# Patient Record
Sex: Female | Born: 1960 | Race: White | Hispanic: No | Smoking: Never smoker
Health system: Southern US, Community
[De-identification: ages and names within clinical notes are randomized; demographics above are authoritative.]

## PROBLEM LIST (undated history)

## (undated) DIAGNOSIS — R51 Headache: Secondary | ICD-10-CM

## (undated) DIAGNOSIS — R3915 Urgency of urination: Secondary | ICD-10-CM

## (undated) DIAGNOSIS — R000 Tachycardia, unspecified: Secondary | ICD-10-CM

## (undated) DIAGNOSIS — F419 Anxiety disorder, unspecified: Secondary | ICD-10-CM

## (undated) DIAGNOSIS — F32A Depression, unspecified: Secondary | ICD-10-CM

## (undated) DIAGNOSIS — F329 Major depressive disorder, single episode, unspecified: Secondary | ICD-10-CM

## (undated) DIAGNOSIS — K649 Unspecified hemorrhoids: Secondary | ICD-10-CM

## (undated) DIAGNOSIS — M1990 Unspecified osteoarthritis, unspecified site: Secondary | ICD-10-CM

## (undated) DIAGNOSIS — G8929 Other chronic pain: Secondary | ICD-10-CM

## (undated) DIAGNOSIS — R519 Headache, unspecified: Secondary | ICD-10-CM

## (undated) DIAGNOSIS — I10 Essential (primary) hypertension: Secondary | ICD-10-CM

## (undated) DIAGNOSIS — G2581 Restless legs syndrome: Secondary | ICD-10-CM

## (undated) DIAGNOSIS — D649 Anemia, unspecified: Secondary | ICD-10-CM

## (undated) DIAGNOSIS — G4733 Obstructive sleep apnea (adult) (pediatric): Secondary | ICD-10-CM

## (undated) HISTORY — DX: Major depressive disorder, single episode, unspecified: F32.9

## (undated) HISTORY — DX: Other chronic pain: G89.29

## (undated) HISTORY — PX: MENISCUS REPAIR: SHX5179

## (undated) HISTORY — DX: Unspecified osteoarthritis, unspecified site: M19.90

## (undated) HISTORY — DX: Tachycardia, unspecified: R00.0

## (undated) HISTORY — DX: Essential (primary) hypertension: I10

## (undated) HISTORY — DX: Restless legs syndrome: G25.81

## (undated) HISTORY — DX: Urgency of urination: R39.15

## (undated) HISTORY — DX: Anxiety disorder, unspecified: F41.9

## (undated) HISTORY — DX: Unspecified hemorrhoids: K64.9

## (undated) HISTORY — PX: ROTATOR CUFF REPAIR: SHX139

## (undated) HISTORY — DX: Obstructive sleep apnea (adult) (pediatric): G47.33

## (undated) HISTORY — DX: Anemia, unspecified: D64.9

## (undated) HISTORY — DX: Headache: R51

## (undated) HISTORY — DX: Depression, unspecified: F32.A

## (undated) HISTORY — DX: Headache, unspecified: R51.9

---

## 1997-09-10 HISTORY — PX: NASAL SEPTUM SURGERY: SHX37

## 1997-09-10 HISTORY — PX: UVULOPALATOPHARYNGOPLASTY, TONSILLECTOMY AND SEPTOPLASTY: SHX2632

## 2003-10-27 ENCOUNTER — Ambulatory Visit (HOSPITAL_COMMUNITY): Admission: RE | Admit: 2003-10-27 | Discharge: 2003-10-27 | Payer: Self-pay | Admitting: Specialist

## 2004-03-23 ENCOUNTER — Encounter: Admission: RE | Admit: 2004-03-23 | Discharge: 2004-05-08 | Payer: Self-pay | Admitting: Specialist

## 2004-07-04 ENCOUNTER — Ambulatory Visit (HOSPITAL_BASED_OUTPATIENT_CLINIC_OR_DEPARTMENT_OTHER): Admission: RE | Admit: 2004-07-04 | Discharge: 2004-07-04 | Payer: Self-pay | Admitting: Otolaryngology

## 2005-01-12 ENCOUNTER — Ambulatory Visit (HOSPITAL_BASED_OUTPATIENT_CLINIC_OR_DEPARTMENT_OTHER): Admission: RE | Admit: 2005-01-12 | Discharge: 2005-01-12 | Payer: Self-pay | Admitting: Specialist

## 2009-09-09 ENCOUNTER — Inpatient Hospital Stay (HOSPITAL_COMMUNITY): Admission: EM | Admit: 2009-09-09 | Discharge: 2009-09-13 | Payer: Self-pay | Admitting: Emergency Medicine

## 2009-09-13 ENCOUNTER — Inpatient Hospital Stay (HOSPITAL_COMMUNITY): Admission: AD | Admit: 2009-09-13 | Discharge: 2009-09-16 | Payer: Self-pay | Admitting: Psychiatry

## 2009-09-13 ENCOUNTER — Ambulatory Visit: Payer: Self-pay | Admitting: Psychiatry

## 2010-01-27 ENCOUNTER — Encounter (INDEPENDENT_AMBULATORY_CARE_PROVIDER_SITE_OTHER): Payer: Self-pay

## 2010-10-12 NOTE — Letter (Signed)
Summary: Recall, Screening Colonoscopy Only  [facility]  [address]   [place], [place] [address]   Phone: [phone]  Fax: [phone]    Jan 27, 2010  Caitlin Holmes [address] [place], [place]  [address] [birth year]   Dear Caitlin Holmes,   Our records indicate it is time to schedule your colonoscopy.   Please call our office at [phone] and ask for the nurse.   Thank you,  Hendricks Limes, LPN Cloria Spring, LPN  [facility] Ph: [phone]   Fax: [phone]

## 2010-11-26 LAB — BASIC METABOLIC PANEL
BUN: 7 mg/dL (ref 6–23)
CO2: 27 mEq/L (ref 19–32)
CO2: 29 mEq/L (ref 19–32)
Chloride: 103 mEq/L (ref 96–112)
Chloride: 106 mEq/L (ref 96–112)
GFR calc Af Amer: >60 mL/min (ref >60)
GFR calc non Af Amer: >60 mL/min (ref >60)
Glucose, Bld: 103 mg/dL — ABNORMAL HIGH (ref 70–99)
Potassium: 3.6 mEq/L (ref 3.5–5.1)
Potassium: 3.6 mEq/L (ref 3.5–5.1)
Sodium: 140 mEq/L (ref 135–145)

## 2010-11-26 LAB — TSH: TSH: 4.264 u[IU]/mL (ref 0.350–4.500)

## 2010-12-11 LAB — COMPREHENSIVE METABOLIC PANEL
ALT: 28 U/L (ref 0–35)
AST: 27 U/L (ref 0–37)
Alkaline Phosphatase: 83 U/L (ref 39–117)
CO2: 27 mEq/L (ref 19–32)
Calcium: 8.1 mg/dL — ABNORMAL LOW (ref 8.4–10.5)
GFR calc Af Amer: >60 mL/min (ref >60)
Glucose, Bld: 87 mg/dL (ref 70–99)
Potassium: 4.2 mEq/L (ref 3.5–5.1)
Sodium: 137 mEq/L (ref 135–145)
Total Protein: 6.3 g/dL (ref 6.0–8.3)

## 2010-12-11 LAB — CBC
Hemoglobin: 10.2 g/dL — ABNORMAL LOW (ref 12.0–15.0)
MCHC: 32.6 g/dL (ref 30.0–36.0)
RBC: 4.14 MIL/uL (ref 3.87–5.11)
RDW: 15.2 % (ref 11.5–15.5)

## 2010-12-11 LAB — URINALYSIS, ROUTINE W REFLEX MICROSCOPIC
Glucose, UA: NEGATIVE mg/dL
Ketones, ur: NEGATIVE mg/dL
Protein, ur: NEGATIVE mg/dL
pH: 7.5 (ref 5.0–8.0)

## 2010-12-11 LAB — ETHANOL: Alcohol, Ethyl (B): <5 mg/dL (ref 0–10)

## 2010-12-11 LAB — RAPID URINE DRUG SCREEN, HOSP PERFORMED
Amphetamines: NOT DETECTED
Barbiturates: NOT DETECTED
Benzodiazepines: POSITIVE — AB
Cocaine: NOT DETECTED
Opiates: NOT DETECTED

## 2010-12-11 LAB — DIFFERENTIAL
Basophils Relative: 1 % (ref 0–1)
Eosinophils Absolute: 0.1 10*3/uL (ref 0.0–0.7)
Eosinophils Relative: 1 % (ref 0–5)
Lymphs Abs: 1.3 10*3/uL (ref 0.7–4.0)
Monocytes Relative: 4 % (ref 3–12)
Neutrophils Relative %: 78 % — ABNORMAL HIGH (ref 43–77)

## 2010-12-11 LAB — ACETAMINOPHEN LEVEL: Acetaminophen (Tylenol), Serum: <10 ug/mL — ABNORMAL LOW (ref 10–30)

## 2010-12-11 LAB — GLUCOSE, CAPILLARY: Glucose-Capillary: 86 mg/dL (ref 70–99)

## 2011-01-26 NOTE — Op Note (Signed)
NAME:  Caitlin Holmes, Caitlin Holmes                          ACCOUNT NO.:  [id number]   MEDICAL RECORD NO.:  [id number]                   PATIENT TYPE:  AMB   LOCATION:  DAY                                  FACILITY:  [facility]   PHYSICIAN:  Jene Every, M.D.                 DATE OF BIRTH:  [birth year]   DATE OF PROCEDURE:  10/27/2003  DATE OF DISCHARGE:                                 OPERATIVE REPORT   PREOPERATIVE DIAGNOSES:  Adhesive capsulitis, impingement syndrome, and  rotator cuff tear, right shoulder.   POSTOPERATIVE DIAGNOSES:  Labral tear, partial rotator cuff tear,  impingement syndrome, and adhesive capsulitis.   PROCEDURES:  1. Examination under anesthesia, followed by manipulation under anesthesia.  2. Right shoulder arthroscopy, subacromial decompression, bursectomy,     acromioplasty, debridement of labral tear, lavage of glenohumeral joint.   ANESTHESIA:  General.   ASSISTANT:  Roma Schanz, P.A.   BRIEF HISTORY AND INDICATION:  A 50 year old with persistent impingement-  type pain, adhesive capsulitis, diminished internal rotation, MRI indicating  possible rotator cuff tear.  She was refractory to conservative treatment,  including corticosteroid injections.  Operative intervention is indicated  for evaluation of glenohumeral joint and of subacromial space.  She had some  glenohumeral arthrosis noted.  Mild AC arthrosis, nontender over the [facility].  MRI indicating possible rotator cuff tear.  Risks and benefits discussed  including bleeding, infection, damage to vascular structures, no change in  symptoms, worsening symptoms, need for open repair, etc.   DESCRIPTION OF PROCEDURE:  Patient in supine position.  After induction of  adequate general anesthesia and 1 g Kefzol, she was placed in the right  lateral decubitus position.  Examination revealed she lacked probably 20  degrees of full abduction and forward flexion.  I then provided a gentle  manipulation to achieve full  forward flexion, followed by full abduction.  She demonstrated a slight decrease in external rotation, but I was able to  achieve 60 degrees of external rotation.  We internally rotated, and we were  able to approximate the L3-4 spinous process.   This was done very gently and there was no obvious sign of fracture or  significant soft tissue injury.  Following this I demarcated the acromion,  the AC joint, and the coracoid with a surgical marker and then prepped and  draped the shoulder and upper extremity in the usual sterile fashion.  Standard incision was made in the posterolateral aspect of the shoulder in  the skin.  The blunt cannula from the scope through this portal in line of  the coracoid, penetrating the glenohumeral joint atraumatically.  She was in  the 70/30 position with 10 pounds of traction.  Examination of the  glenohumeral joint revealed some mild glenohumeral arthrosis.  There were  some small cartilaginous loose bodies.  There was some degenerative tearing  and fraying of the anterior labrum.  I placed an anterior portal, made  incision through the skin only, advanced it toward the glenohumeral joint  just below the biceps tendon in a line between the coracoid and the  anterolateral aspect of the acromion a third of the way from the acromion.  This was without difficulty.  I introduced the shaver and utilized it to  lavage the joint, remove the loose cartilaginous debris, and debride the  labrum.  There was no separation of the labrum.  There was no biceps tendon  tear or SLAP tear.  The subscapularis was unremarkable.  Looking at the  rotator cuff tendon, there was some minor tearing from its undersurface in  the supraspinatus area, but it did not appear to be full-thickness.  This  was examined in full internal and external rotation, probing up underneath  the rotator cuff and evaluating it thoroughly.  Following this I redirected  the camera into the subacromial  space, utilizing the blunt cannula once  again without difficulty.  A third portal was utilized anterolaterally  through the skin only, directing the blunt cannula into the subacromial  space.  She had exuberant hypertrophic bursitis within this space.  I  introduced a shaver and utilized it to perform a bursectomy.  We had placed  the arm more in the 30/0 position after moving in the subacromial space.  After full bursectomy and delineation of the anterolateral aspect of the  acromion by probing, we introduced an Arthro-Wand and detached the CA  ligament from its insertion and removed a portion of this as well.  This was  carried from the anterolateral aspect of the acromion to near the [facility] joint.  There was no evidence of significant improvement by the [facility] joint.  After  skeletonizing the anterior lateral aspect of the undersurface of the  acromion, there was a small spur noted and we introduced the bur and  performed acromioplasty, approximately 3 mm to the anterolateral aspect of  the acromion was performed.  We preserved the attachment of the deltoid  fibers.  Again used the Arthro-Wand in full internal and external rotation  and performed a full bursectomy in different rotations and positioning of  the arm.  We felt no evidence of a rotator cuff tear.  There were multiple  adhesions that were released just prior to the bursectomy just utilizing the  blunt cannula.  Probing, there was no evidence of a significant rotator cuff  tear.  Following this I felt there was adequate decompression, removal of  the bursa, and evaluation of the rotator cuff such that there was no  significant rotator cuff tear.  We then removed all instrumentation.  The  portals were closed with 4-0 nylon simple suture, 0.25% Marcaine with  epinephrine was infiltrated in the joint, and the wound was dressed  sterilely.  Placed in a sling, extubated without difficulty, and transported to the recovery room in  satisfactory condition.  The patient tolerated the  procedure well with no complications.                                               Jene Every, M.D.    Cordelia Pen  D:  10/27/2003  T:  10/27/2003  Job:  [id number]

## 2011-01-26 NOTE — Op Note (Signed)
[name], [name]                ACCOUNT NO.:  [id number]   MEDICAL RECORD NO.:  [id number]          PATIENT TYPE:  AMB   LOCATION:  [facility]                         FACILITY:  [facility]   PHYSICIAN:  Jene Every, M.D.    DATE OF BIRTH:  04/30/1961   DATE OF PROCEDURE:  01/12/2005  DATE OF DISCHARGE:                                 OPERATIVE REPORT   PREOPERATIVE DIAGNOSES:  Medial meniscal tear left knee.   POSTOPERATIVE DIAGNOSES:  Medial meniscal tear left knee, grade 3  chondromalacia medial femoral condyle.   PROCEDURE:  Left knee arthroscopy, partial medial meniscectomy,  chondroplasty of the medial femoral condyle.   ANESTHESIA:  General.   SURGEON:  Jene Every, M.D.   ASSISTANT:  None.   INDICATIONS FOR PROCEDURE:  This is a 50 year old with refractory knee pain,  MRI indicating medial meniscus tear. Operative intervention was indicated  for partial medial meniscectomy and debridement. The risks and benefits were  discussed including bleeding, infection, injury to neurovascular structures,  no change in symptoms, worsening symptoms, need for repeat debridement in  the future.   TECHNIQUE:  The patient in supine position, after the induction of adequate  anesthesia, 1 g of Kefzol, the left lower extremity was prepped and draped  in the usual sterile fashion. A lateral parapatellar portal and a  superomedial parapatellar portal was fashioned with a #11 blade, Ingress  cannula atraumatically placed. Irrigant was utilized to insufflate the  joint. Under direct visualization, a medial parapatellar portal was  fashioned with _a #11 blade_________ after localization with an 18 gauge  needle. After localization of the posterior horn and medial meniscus tear,  it was felt I had made multiple passes, in terms of the optimal portal, to  address the posterior horn and medial meniscus. This was fashioned with a  #11 blade sparing the medial meniscus. Hypertrophic synovitis was  noted in  the medial compartment as well as shavers introduced __to________ performed  debride the hypertrophic synovitis. There was a complex tear of the  posterior third of the medial meniscus, this was dissected with an upbiting  rongeur to a stable base. Further contour with a 4-2 Kuda shaver. The  remainder was stable to propalpation. Approximately 50% of the posterior  third had been resected. There was a chondral flap tear of the medial  femoral condyle. Chondroplasty was performed. The posterior medial aspect of  the medial femoral condyle was noted a small chondral lesion near a deep  grade 3 measuring a centimeter in diameter. The edges were debrided to a  stable mass. There were some minor changes of the tibial plateau, ACL was  hyperemic without evidence of a tear, PCL was unremarkable. The lateral  compartment revealed a normal lateral meniscus. Femoral condyle and tibial  plateau stable to propalpation without evidence of tear. The patellofemoral  joint ___was_______ unremarkable, normal patellofemoral tracking. The  gutters are unremarkable. Copiously lavaged, knee reexamined in the medial  compartment. Stable meniscus to propalpation.   All instrumentation was removed. The portals were closed with 4-0 nylon  simple suture, 0.25% Marcaine with epinephrine  was infiltrated in the joint.  The wound was dressed sterilely and she was awoken without difficulty and  transported to the recovery room in satisfactory condition.   The patient tolerated the procedure well with no complications.      JB/MEDQ  D:  01/12/2005  T:  01/12/2005  Job:  [id number]

## 2011-01-26 NOTE — Procedures (Signed)
NAME:  Caitlin Holmes, COZAD NO.:  [id number]   MEDICAL RECORD NO.:  [id number]          PATIENT TYPE:  OUT   LOCATION:  SLEEP CENTER                 FACILITY:  [facility]   PHYSICIAN:  Clinton D. Maple Hudson, M.D. DATE OF BIRTH:  [birth year]   DATE OF STUDY:  07/04/2004                              NOCTURNAL POLYSOMNOGRAM   REFERRING PHYSICIAN:  Onalee Hua L. Annalee Genta, M.D.   INDICATION FOR STUDY:  Hypersomnia with sleep apnea.   Epworth Sleepiness score 11/24.  Neck size 16 inches.  BMI 31.6.  Weight 179  pounds.   SLEEP ARCHITECTURE:  The patient takes Ambien, Lexapro and NyQuil at home,  but it was indicated that no medications were taken for this study.  She  slept with a fan on for white noise that she does at home.  Total sleep time  was 401 minutes with sleep efficiency 78%.  Stage I was 5%, stage II 78%,  stages III and IV were 11%.  REM was 7% of total sleep time.  Sleep latency  was 27 minutes.  REM latency was 457 minutes.  Awake after sleep onset 84  minutes.  Arousal index 62 which was markedly increased and was only partly  related to respiratory events.  Most arousals were nonspecific.   RESPIRATORY DATA:  Split study protocol.  RDI 49 per hour indicating severe  obstructive sleep apnea/hypopnea syndrome before CPAP.  This included 1  obstructive apnea and 139 hypopnea's before CPAP.  The events were not  positional.  REM RDI was 0.  CPAP was titrated to 13 CWP, RDI of 0 per hour  using a small Res Med UltraMirage full face mask with heated humidifier.   OXYGEN DATA:  Moderate snoring with oxygen desaturation to a nadir of 75%  during apneas before CPAP.  After CPAP titration oxygen saturation held 95%  to 96% on room air.   CARDIAC DATA:  Normal sinus rhythm.   MOVEMENTS/PARASOMNIA:  300 to 27 limb jerks were recorded of which 43 were  associated with arousal or awakening for a periodic limb movement with  arousal index of 6.4 per hour which has increased.   IMPRESSION/RECOMMENDATION:  Severe obstructive sleep apnea/hypopnea  syndrome, RDI of 49 per hour with desaturation to 75%.  CPAP titration to 13  CWP, RDI 0 per hour using a small Res Med UltraMirage full face mask with  heated humidifier.  Periodic limb movement with arousal syndrome, 6.4 per  hour.                                                           Clinton D. Maple Hudson, M.D.  Diplomate, [employer] Board  CDY/MEDQ  D:  07/09/2004 09:25:59  T:  07/09/2004 14:36:50  Job:  [id number]

## 2011-05-17 ENCOUNTER — Ambulatory Visit (INDEPENDENT_AMBULATORY_CARE_PROVIDER_SITE_OTHER): Payer: BC Managed Care – PPO | Admitting: Pulmonary Disease

## 2011-05-17 ENCOUNTER — Encounter: Payer: Self-pay | Admitting: Pulmonary Disease

## 2011-05-17 VITALS — BP 154/80 | HR 76 | Ht 63.0 in | Wt 177.2 lb

## 2011-05-17 DIAGNOSIS — R05 Cough: Secondary | ICD-10-CM

## 2011-05-17 DIAGNOSIS — R059 Cough, unspecified: Secondary | ICD-10-CM

## 2011-05-17 MED ORDER — HYDROCOD POLST-CPM POLST ER 10-8 MG PO CP12: 1.0000 | ORAL_CAPSULE | Freq: Two times a day (BID) | ORAL | Status: DC | PRN

## 2011-05-17 MED ORDER — BENZONATATE 100 MG PO CAPS: 200.0000 mg | ORAL_CAPSULE | Freq: Four times a day (QID) | ORAL | Status: AC | PRN

## 2011-05-17 NOTE — Patient Instructions (Addendum)
Will treat with cyclical cough protocol for 3 days.  See handout No throat clearing, use hard candy, limit voice use as discussed. followup with me about 1-2 weeks after completing cyclical cough protocol nexium 40mg  each am about before breakfast.

## 2011-05-17 NOTE — Assessment & Plan Note (Signed)
The patient clearly has an upper airway cough that I suspect is related to a cyclical mechanism, as well as an element of laryngopharyngeal reflux.  It is unclear whether chronic sinus disease or postnasal drip is contributing to this.  At this point, I would like to treat her with cyclical cough protocol, as well as proton pump inhibitor for possible LPR.  I have reviewed the cyclical cough protocol with her in detail, and have given her a patient information sheet.

## 2011-05-17 NOTE — Progress Notes (Signed)
  Subjective:    Patient ID: Caitlin Holmes, female    DOB: 1960/12/01, 50 y.o.   MRN: [id number]  HPI The patient is a 50 year old female who I've been asked to see for chronic cough.  The patient states she was in her usual state of health until approximately 6 months ago, when she began to develop upper respiratory symptoms.  She was treated with antibiotics, as well as cough medication.  The cough improved for a short period of time and then quickly returned.  She was treated with a course of prednisone, another course of antibiotics, and also given an inhaler.  She feels none of this helped.  She was on an ACE inhibitor, but has been off the medication for the last 4 months.  Patient also has had a chest x-ray 2 months ago which he tells me was normal.  Patient states that her cough is very severe at times, along with classic paroxysms.  She will strangle and have regurgitation with the cough.  She feels a heaviness and soreness in her chest from all of her coughing.  She describes a classic globus sensation in her throat, and states that her cough is worse with talking.  She denies significant throat clearing, but she has done this throughout our visit.  She denies any issues with shortness of breath, postnasal drip, nasal congestion, or rhinorrhea.  She denies a history of reflux disease.   Review of Systems  Constitutional: Negative for fever and unexpected weight change.  HENT: Positive for sore throat and trouble swallowing. Negative for ear pain, nosebleeds, congestion, rhinorrhea, sneezing, dental problem, postnasal drip and sinus pressure.   Eyes: Negative for redness and itching.  Respiratory: Positive for cough and shortness of breath. Negative for chest tightness and wheezing.   Cardiovascular: Positive for chest pain and palpitations. Negative for leg swelling.  Gastrointestinal: Negative for nausea, vomiting and abdominal pain.  Genitourinary: Negative for dysuria.  Musculoskeletal:  Positive for joint swelling.  Skin: Negative for rash.  Neurological: Positive for headaches.  Hematological: Does not bruise/bleed easily.  Psychiatric/Behavioral: Positive for dysphoric mood. The patient is nervous/anxious.        Objective:   Physical Exam Constitutional:  Well developed, no acute distress  HENT:  Nares patent without discharge  Oropharynx without exudate, palate and uvula are normal  Eyes:  Perrla, eomi, no scleral icterus  Neck:  No JVD, no TMG  Cardiovascular:  Normal rate, regular rhythm, no rubs or gallops.  No murmurs        Intact distal pulses  Pulmonary :  Normal breath sounds, no stridor or respiratory distress   No rales, rhonchi, or wheezing  Abdominal:  Soft, nondistended, bowel sounds present.  No tenderness noted.   Musculoskeletal:  No lower extremity edema noted.  Lymph Nodes:  No cervical lymphadenopathy noted  Skin:  No cyanosis noted  Neurologic:  Alert, appropriate, moves all 4 extremities without obvious deficit.         Assessment & Plan:

## 2012-01-08 ENCOUNTER — Ambulatory Visit (INDEPENDENT_AMBULATORY_CARE_PROVIDER_SITE_OTHER): Payer: BC Managed Care – PPO | Admitting: Cardiology

## 2012-01-08 VITALS — BP 140/90 | HR 104 | Ht 63.0 in | Wt 188.0 lb

## 2012-01-08 DIAGNOSIS — R0600 Dyspnea, unspecified: Secondary | ICD-10-CM

## 2012-01-08 DIAGNOSIS — R079 Chest pain, unspecified: Secondary | ICD-10-CM

## 2012-01-08 DIAGNOSIS — R0609 Other forms of dyspnea: Secondary | ICD-10-CM

## 2012-01-08 DIAGNOSIS — R002 Palpitations: Secondary | ICD-10-CM

## 2012-01-08 DIAGNOSIS — E785 Hyperlipidemia, unspecified: Secondary | ICD-10-CM

## 2012-01-08 MED ORDER — METOPROLOL TARTRATE 25 MG PO TABS: 25.0000 mg | ORAL_TABLET | Freq: Two times a day (BID) | ORAL | Status: DC

## 2012-01-08 NOTE — Patient Instructions (Addendum)
Your physician has requested that you have an echocardiogram. Echocardiography is a painless test that uses sound waves to create images of your heart. It provides your doctor with information about the size and shape of your heart and how well your heart's chambers and valves are working. This procedure takes approximately one hour. There are no restrictions for this procedure. ECHO WITH BUBBLE STUDY.  Your physician has requested that you have a lexiscan myoview. For further information please visit [email or web address] Please follow instruction sheet, as given.  Start metoprolol tartrate 25mg  twice a day.  Start aspirin 81mg  daily.  Your physician recommends that you schedule a follow-up appointment in: about 3 weeks with Dr Shirlee Latch.

## 2012-01-09 ENCOUNTER — Encounter: Payer: Self-pay | Admitting: Cardiology

## 2012-01-09 DIAGNOSIS — R0600 Dyspnea, unspecified: Secondary | ICD-10-CM | POA: Insufficient documentation

## 2012-01-09 DIAGNOSIS — R079 Chest pain, unspecified: Secondary | ICD-10-CM | POA: Insufficient documentation

## 2012-01-09 DIAGNOSIS — R002 Palpitations: Secondary | ICD-10-CM | POA: Insufficient documentation

## 2012-01-09 DIAGNOSIS — E785 Hyperlipidemia, unspecified: Secondary | ICD-10-CM | POA: Insufficient documentation

## 2012-01-09 NOTE — Assessment & Plan Note (Signed)
By description, it sounds like she has PACs or PVCs.  Only mild sinus tachycardia was documented on event monitor.  Given distressing symptoms, I will have her try metoprolol 25 mg bid to see if this helps.

## 2012-01-09 NOTE — Assessment & Plan Note (Signed)
She is not taking atorvastatin or Tricor due to cost.  I will try to get a copy of her lipids from PCP's office.  We could probably restart her on less expensive meds.

## 2012-01-09 NOTE — Progress Notes (Signed)
PCP: Dr. Christell Constant, Paulene Floor  51 yo with history of HTN and depression presents for evaluation of palpitations and chest pain.  Patient has had palpitations for about 6 months.  This tends to present as momentary "flip-flopping" or skipped beats.  She wore and event monitor that showed only mild sinus tachycardia.  Today, ECG shows mild sinus tachycardia.  She has cut out caffeine without much effect.  Patient also reports 6 months of episodes of chest tightness.  This will radiate to the left arm.  It can occur with exertion or at rest.  Episodes are becoming more frequent.  She is not very active because of a heel fracture that limits how far she walks.  She does report dyspnea after walking about 50 yards.  She apparently was told that she had a "hole" in her heart when she was a child.  She had an echo done a couple of years ago in [place] that reportedly also showed the "hole" in her heart.  Finally, she has not been taking atorvastatin or Tricor because they are too expensive for her.    ECG: NSR, nonspecific T wave flattening, Qs in V1 and V2.   PMH: 1. HTN 2. Depression 3. 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  Apparently also seen on an echo done a few years ago in [place].  5. Palpitations: Event monitor in 4/13 showed only mild sinus tachycardia.   SH: Lives in [place].  Divorced, 1 daughter.  Disabled (depression).  Nonsmoker.   FH: Sister with "hole in her heart."   ROS: All systems reviewed and negative except as per HPI.   Current Outpatient Prescriptions  Medication Sig Dispense Refill  . atorvastatin (LIPITOR) 40 MG tablet Take 40 mg by mouth daily.      Marland Kitchen buPROPion (WELLBUTRIN XL) 300 MG 24 hr tablet Take 300 mg by mouth daily.        . calcium citrate-vitamin D (CITRACAL+D) 315-200 MG-UNIT per tablet Take 1 tablet by mouth 2 (two) times daily.        . citalopram (CELEXA) 20 MG tablet Take 20 mg by mouth daily.      . clonazePAM (KLONOPIN) 2 MG tablet Take 1 mg by  mouth 4 (four) times daily. Take 1/2 half four times daily      . cycloSPORINE (RESTASIS) 0.05 % ophthalmic emulsion 2 drops 2 (two) times daily.      . fenofibrate micronized (ANTARA) 130 MG capsule Take 130 mg by mouth daily before breakfast.      . hydrocortisone cream 0.5 % Apply topically 2 (two) times daily.      Marland Kitchen loperamide (IMODIUM A-D) 2 MG tablet Take 2 mg by mouth as needed.        Marland Kitchen losartan-hydrochlorothiazide (HYZAAR) 100-12.5 MG per tablet Take 1 tablet by mouth daily.      . pramipexole (MIRAPEX) 0.25 MG tablet Take 0.25 mg by mouth as needed.        Marland Kitchen QUEtiapine (SEROQUEL) 100 MG tablet Take 100 mg by mouth at bedtime.      . tolterodine (DETROL LA) 4 MG 24 hr capsule Take 4 mg by mouth daily.      Marland Kitchen aspirin EC 81 MG tablet Take 1 tablet (81 mg total) by mouth daily.      . Hydrocod Polst-Chlorphen Polst (TUSSICAPS) 10-8 MG CP12 Take 1 capsule by mouth 2 (two) times daily as needed.  12 each  0  . metoprolol tartrate (LOPRESSOR) 25 MG  tablet Take 1 tablet (25 mg total) by mouth 2 (two) times daily.  60 tablet  11    BP 140/90  Pulse 104  Ht 5\' 3"  (1.6 m)  Wt 188 lb (85.276 kg)  BMI 33.30 kg/m2 General: NAD, overweight Neck: No JVD, no thyromegaly or thyroid nodule.  Lungs: Clear to auscultation bilaterally with normal respiratory effort. CV: Nondisplaced PMI.  Heart regular S1/S2, no S3/S4, no murmur.  No peripheral edema.  No carotid bruit.  Normal pedal pulses.  Abdomen: Soft, nontender, no hepatosplenomegaly, no distention.  Skin: Intact without lesions or rashes.  Neurologic: Alert and oriented x 3.  Psych: Normal affect. Extremities: No clubbing or cyanosis.  HEENT: Normal.

## 2012-01-09 NOTE — Assessment & Plan Note (Signed)
Patient has chest pain with both typical and atypical features.  Risk factors for CAD include HTN and hyperlipidemia.  She does not think she could walk on a treadmill because of her heel.  I will arrange for a Lexiscan myoview to assess for ischemia.  I would like her to take ASA 81 mg daily while evaluation is ongoing.

## 2012-01-09 NOTE — Assessment & Plan Note (Signed)
Patient reports significant exertional dyspnea.  This could certainly be from deconditioning as she has not been very active.  I will get an echo.  This will allow assessment of LV and RV function and will also allow me to assess for congenital heart disease given history of "hole" in heart.  She will need to get a bubble study with the echo.

## 2012-01-16 ENCOUNTER — Encounter (HOSPITAL_COMMUNITY): Payer: Self-pay | Admitting: Cardiology

## 2012-01-16 ENCOUNTER — Other Ambulatory Visit: Payer: Self-pay

## 2012-01-16 ENCOUNTER — Ambulatory Visit (HOSPITAL_COMMUNITY): Payer: BC Managed Care – PPO | Attending: Cardiology

## 2012-01-16 DIAGNOSIS — R002 Palpitations: Secondary | ICD-10-CM | POA: Insufficient documentation

## 2012-01-16 DIAGNOSIS — R079 Chest pain, unspecified: Secondary | ICD-10-CM | POA: Insufficient documentation

## 2012-01-16 DIAGNOSIS — E785 Hyperlipidemia, unspecified: Secondary | ICD-10-CM | POA: Insufficient documentation

## 2012-01-16 NOTE — Progress Notes (Signed)
Patient ID: Caitlin Holmes, female   DOB: 04-29-1961, 51 y.o.   MRN: [id number] 20G IV R Antecubital x 1 started for Bubble Study. Tolerated Well. Site unremarkable. Bonnita Levan, RN

## 2012-01-17 ENCOUNTER — Ambulatory Visit (HOSPITAL_COMMUNITY): Payer: BC Managed Care – PPO | Attending: Cardiology | Admitting: Radiology

## 2012-01-17 DIAGNOSIS — I10 Essential (primary) hypertension: Secondary | ICD-10-CM | POA: Insufficient documentation

## 2012-01-17 DIAGNOSIS — E785 Hyperlipidemia, unspecified: Secondary | ICD-10-CM | POA: Insufficient documentation

## 2012-01-17 DIAGNOSIS — R61 Generalized hyperhidrosis: Secondary | ICD-10-CM | POA: Insufficient documentation

## 2012-01-17 DIAGNOSIS — R110 Nausea: Secondary | ICD-10-CM | POA: Insufficient documentation

## 2012-01-17 DIAGNOSIS — R0609 Other forms of dyspnea: Secondary | ICD-10-CM | POA: Insufficient documentation

## 2012-01-17 DIAGNOSIS — R9431 Abnormal electrocardiogram [ECG] [EKG]: Secondary | ICD-10-CM | POA: Insufficient documentation

## 2012-01-17 DIAGNOSIS — R0602 Shortness of breath: Secondary | ICD-10-CM

## 2012-01-17 DIAGNOSIS — R5383 Other fatigue: Secondary | ICD-10-CM | POA: Insufficient documentation

## 2012-01-17 DIAGNOSIS — E669 Obesity, unspecified: Secondary | ICD-10-CM | POA: Insufficient documentation

## 2012-01-17 DIAGNOSIS — M79609 Pain in unspecified limb: Secondary | ICD-10-CM | POA: Insufficient documentation

## 2012-01-17 DIAGNOSIS — R002 Palpitations: Secondary | ICD-10-CM | POA: Insufficient documentation

## 2012-01-17 DIAGNOSIS — R000 Tachycardia, unspecified: Secondary | ICD-10-CM | POA: Insufficient documentation

## 2012-01-17 DIAGNOSIS — R0989 Other specified symptoms and signs involving the circulatory and respiratory systems: Secondary | ICD-10-CM | POA: Insufficient documentation

## 2012-01-17 DIAGNOSIS — R5381 Other malaise: Secondary | ICD-10-CM | POA: Insufficient documentation

## 2012-01-17 DIAGNOSIS — R42 Dizziness and giddiness: Secondary | ICD-10-CM | POA: Insufficient documentation

## 2012-01-17 DIAGNOSIS — R079 Chest pain, unspecified: Secondary | ICD-10-CM | POA: Insufficient documentation

## 2012-01-17 MED ORDER — TECHNETIUM TC 99M TETROFOSMIN IV KIT
33.0000 | PACK | Freq: Once | INTRAVENOUS | Status: AC | PRN
  Administered 2012-01-17: 33 via INTRAVENOUS

## 2012-01-17 MED ORDER — TECHNETIUM TC 99M TETROFOSMIN IV KIT
11.0000 | PACK | Freq: Once | INTRAVENOUS | Status: AC | PRN
  Administered 2012-01-17: 11 via INTRAVENOUS

## 2012-01-17 MED ORDER — REGADENOSON 0.4 MG/5ML IV SOLN
0.4000 mg | Freq: Once | INTRAVENOUS | Status: AC
  Administered 2012-01-17: 0.4 mg via INTRAVENOUS

## 2012-01-17 NOTE — Progress Notes (Addendum)
[facility] SITE 3 NUCLEAR MED [address] [place] [address] [phone]  Cardiology Nuclear Med Study  Caitlin Holmes is a 51 y.o. female     MRN : [id number]     DOB: 1961/08/06  Procedure Date: 01/17/2012  Nuclear Med Background Indication for Stress Test:  Evaluation for Ischemia and Abnormal EKG History:  01/16/12 Echo Bubble Study:(-), EF=55-60% Cardiac Risk Factors: Hypertension, Lipids and Obesity  Symptoms:  Chest Pain/Tightness>(L) Arm with and without Exertion (last episode of chest discomfort was last night), Diaphoresis, Dizziness, DOE, Fatigue, Nausea, Palpitations and Rapid HR   Nuclear Pre-Procedure Caffeine/Decaff Intake:  None NPO After: 10:00pm   Lungs:  Clear. O2 Sat: 97% on room air. IV 0.9% NS with Angio Cath:  20g  IV Site: R Antecubital  IV Started by:  Stanton Kidney, EMT-P  Chest Size (in):  42 Cup Size: C  Height: 5\' 3"  (1.6 m)  Weight:  187 lb (84.823 kg)  BMI:  Body mass index is 33.13 kg/(m^2). Tech Comments:  Metoprolol held > 24 hours, per patient.    Nuclear Med Study 1 or 2 day study: 1 day  Stress Test Type:  Treadmill/Lexiscan  Reading MD: Marca Ancona, MD  Order Authorizing Provider:  Marca Ancona, MD  Resting Radionuclide: Technetium 51m Tetrofosmin  Resting Radionuclide Dose: 11.0 mCi   Stress Radionuclide:  Technetium 22m Tetrofosmin  Stress Radionuclide Dose: 33.0 mCi           Stress Protocol Rest HR: 93 Stress HR: 127  Rest BP: 148/80 Stress BP: 180/65  Exercise Time (min): 2:00 METS: n/a   Predicted Max HR: 170 bpm % Max HR: 74.71 bpm Rate Pressure Product: 21308   Dose of Adenosine (mg):  n/a Dose of Lexiscan: 0.4 mg  Dose of Atropine (mg): n/a Dose of Dobutamine: n/a mcg/kg/min (at max HR)  Stress Test Technologist: Smiley Houseman, CMA-N  Nuclear Technologist:  Leonia Corona, RT-N     Rest Procedure:  Myocardial perfusion imaging was performed at rest 45 minutes following the intravenous administration  of Technetium 20m Tetrofosmin.  Rest ECG: Nonspecific T-wave flattening.  Stress Procedure:  The patient received IV Lexiscan 0.4 mg over 15-seconds with concurrent low level exercise and then Technetium 69m Tetrofosmin was injected at 30-seconds while the patient continued walking one more minute. There were no significant changes with Lexiscan. Quantitative spect images were obtained after a 45-minute delay.  Stress ECG: No significant change from baseline ECG  QPS Raw Data Images:  Normal; no motion artifact; normal heart/lung ratio. Stress Images:  Normal homogeneous uptake in all areas of the myocardium. Rest Images:  Normal homogeneous uptake in all areas of the myocardium. Subtraction (SDS):  There is no evidence of scar or ischemia. Transient Ischemic Dilatation (Normal <1.22):  .88 Lung/Heart Ratio (Normal <0.45):  .42  Quantitative Gated Spect Images QGS EDV:  65 ml QGS ESV:  15 ml  Impression Exercise Capacity:  Lexiscan with low level exercise. BP Response:  Normal blood pressure response. Clinical Symptoms:  Shortness of breath ECG Impression:  No significant ST segment change suggestive of ischemia. Comparison with Prior Nuclear Study: No previous nuclear study performed  Overall Impression:  Normal stress nuclear study.  LV Ejection Fraction: 78%.  LV Wall Motion:  NL LV Function; NL Wall Motion  [name]    Normal.  Please inform patient.   Kathleen Likins [employer]

## 2012-01-22 NOTE — Progress Notes (Signed)
Pt.notified

## 2012-01-28 ENCOUNTER — Encounter: Payer: Self-pay | Admitting: Cardiology

## 2012-01-30 ENCOUNTER — Ambulatory Visit: Payer: BC Managed Care – PPO | Admitting: Cardiology

## 2012-04-03 ENCOUNTER — Encounter (INDEPENDENT_AMBULATORY_CARE_PROVIDER_SITE_OTHER): Payer: Self-pay

## 2012-04-28 ENCOUNTER — Ambulatory Visit (INDEPENDENT_AMBULATORY_CARE_PROVIDER_SITE_OTHER): Payer: BC Managed Care – PPO | Admitting: General Surgery

## 2012-04-28 ENCOUNTER — Encounter (INDEPENDENT_AMBULATORY_CARE_PROVIDER_SITE_OTHER): Payer: Self-pay | Admitting: General Surgery

## 2012-04-28 VITALS — BP 130/78 | HR 84 | Temp 97.8°F | Resp 20 | Ht 63.0 in | Wt 185.8 lb

## 2012-04-28 DIAGNOSIS — K602 Anal fissure, unspecified: Secondary | ICD-10-CM

## 2012-04-28 NOTE — Progress Notes (Signed)
Chief Complaint  Patient presents with  . Other    eval hems    HISTORY: Pt is a 51 year old female with a several month history of severe anal pain.  She has had a colonoscopy that was negative for masses.  She states that the pain is so bad at times that she cannot sleep.  She has not had any significant bleeding.  She describes having narrow stools and a sense of rectal fullness.  She has to sit on a pillow.  She has not had any external hemorrhoids that she has seen or felt.  She has tried sitz baths, hydrocortisone cream, twice daily stool softeners, and fiber supplements.  Her stools are soft, but she has to strain.    Past Medical History  Diagnosis Date  . Hypertension   . Allergic rhinitis   . Chronic headache   . OSA (obstructive sleep apnea)   . Anemia   . Arthritis     Past Surgical History  Procedure Date  . Nasal septum surgery 1999  . Uvulopalatopharyngoplasty, tonsillectomy and septoplasty 1999    Dr. Clearance Coots at [facility]  . Rotator cuff repair     Right  . Meniscus repair     Right    Current Outpatient Prescriptions  Medication Sig Dispense Refill  . amphetamine-dextroamphetamine (ADDERALL XR) 20 MG 24 hr capsule       . aspirin EC 81 MG tablet Take 1 tablet (81 mg total) by mouth daily.      Marland Kitchen atorvastatin (LIPITOR) 40 MG tablet Take 40 mg by mouth daily.      Marland Kitchen buPROPion (WELLBUTRIN XL) 300 MG 24 hr tablet Take 300 mg by mouth daily.        . calcium citrate-vitamin D (CITRACAL+D) 315-200 MG-UNIT per tablet Take 1 tablet by mouth 2 (two) times daily.        . citalopram (CELEXA) 20 MG tablet Take 20 mg by mouth daily.      . clonazePAM (KLONOPIN) 2 MG tablet Take 1 mg by mouth 4 (four) times daily. Take 1/2 half four times daily      . cycloSPORINE (RESTASIS) 0.05 % ophthalmic emulsion 2 drops 2 (two) times daily.      . fenofibrate micronized (ANTARA) 130 MG capsule Take 130 mg by mouth daily before breakfast.      . Hydrocod Polst-Chlorphen Polst  (TUSSICAPS) 10-8 MG CP12 Take 1 capsule by mouth 2 (two) times daily as needed.  12 each  0  . hydrocortisone cream 0.5 % Apply topically 2 (two) times daily.      Marland Kitchen loperamide (IMODIUM A-D) 2 MG tablet Take 2 mg by mouth as needed.        Marland Kitchen losartan-hydrochlorothiazide (HYZAAR) 100-12.5 MG per tablet Take 1 tablet by mouth daily.      . metoprolol tartrate (LOPRESSOR) 25 MG tablet Take 1 tablet (25 mg total) by mouth 2 (two) times daily.  60 tablet  11  . pramipexole (MIRAPEX) 0.25 MG tablet Take 0.25 mg by mouth as needed.        Marland Kitchen QUEtiapine (SEROQUEL) 100 MG tablet Take 100 mg by mouth at bedtime.      . tolterodine (DETROL LA) 4 MG 24 hr capsule Take 4 mg by mouth daily.      . metoprolol succinate (TOPROL-XL) 50 MG 24 hr tablet          Allergies  Allergen Reactions  . Sertraline Hcl Hives  Family History  Problem Relation Age of Onset  . Emphysema Mother   . Allergies Mother   . Asthma Mother   . Emphysema Maternal Grandmother   . Colon cancer Maternal Grandmother   . Cancer Maternal Grandmother     colon  . Stroke Maternal Grandmother   . Allergies Father   . Prostate cancer Father   . Allergies Brother   . Allergies Sister   . Hypertension Sister   . Diabetes Paternal Grandmother      History   Social History  . Marital Status: Divorced    Spouse Name: Divorced    Number of Children: Y  . Years of Education: N/A   Occupational History  . disability.    Social History Main Topics  . Smoking status: Never Smoker   . Smokeless tobacco: Never Used  . Alcohol Use: No  . Drug Use: No  . Sexually Active: None   Other Topics Concern  . None   Social History Narrative   Lives alone.      REVIEW OF SYSTEMS - PERTINENT POSITIVES ONLY: 12 point review of systems negative other than HPI and PMH   EXAM: Filed Vitals:   04/28/12 0854  BP: 130/78  Pulse: 84  Temp: 97.8 F (36.6 C)  Resp: 20    Gen:  No acute distress.  Well nourished and well  groomed.   Neurological: Alert and oriented to person, place, and time. Coordination normal.  Head: Normocephalic and atraumatic.  Eyes: Conjunctivae are normal. Pupils are equal, round, and reactive to light. No scleral icterus.  Neck: Normal range of motion. Neck supple. No tracheal deviation or thyromegaly present.  Cardiovascular: Normal rate, regular rhythm, and intact distal pulses.   Respiratory: Effort normal.  No respiratory distress. No chest wall tenderness.  GI: The abdomen is soft and nontender.  There is no rebound and no guarding.  Rectal:  No external hemorrhoids seen.  Significant pain with attempted digital rectal exam.  Smallest anoscopy demonstrates probable fissure posteriorly.  This was brief based on patient tolerance.   Musculoskeletal: Normal range of motion. Extremities are nontender.  Skin: Skin is warm and dry. No rash noted. No diaphoresis. No erythema. No pallor. No clubbing, cyanosis, or edema.   Psychiatric: Normal mood and affect. Behavior is normal. Judgment and thought content normal.   ASSESSMENT AND PLAN: Anal fissure Pt with likely anal fissure based on symptoms and exam.  Will try diltiazem gel. Follow up in 6 weeks.  Would also add miralax to decrease straining.       Maudry Diego MD Surgical Oncology, General and Endocrine Surgery [facility]      Visit Diagnoses: 1. Anal fissure     Primary Care Physician: Bennie Pierini, FNP

## 2012-04-28 NOTE — Assessment & Plan Note (Signed)
Pt with likely anal fissure based on symptoms and exam.  Will try diltiazem gel. Follow up in 6 weeks.  Would also add miralax to decrease straining.

## 2012-04-28 NOTE — Patient Instructions (Signed)
Add Miralax 17 gm orally daily.  Generic brand is fine.  Continue fiber, stool softeners, water, and sitz baths. Would just do warm water sitz baths and hold off on epsom salts.    Follow up in 6 weeks.

## 2012-06-09 ENCOUNTER — Encounter (INDEPENDENT_AMBULATORY_CARE_PROVIDER_SITE_OTHER): Payer: BC Managed Care – PPO | Admitting: General Surgery

## 2012-06-30 ENCOUNTER — Encounter (INDEPENDENT_AMBULATORY_CARE_PROVIDER_SITE_OTHER): Payer: BC Managed Care – PPO | Admitting: General Surgery

## 2012-12-15 ENCOUNTER — Encounter (INDEPENDENT_AMBULATORY_CARE_PROVIDER_SITE_OTHER): Payer: BC Managed Care – PPO | Admitting: General Surgery

## 2012-12-18 ENCOUNTER — Other Ambulatory Visit: Payer: Self-pay | Admitting: Nurse Practitioner

## 2013-01-01 ENCOUNTER — Telehealth: Payer: Self-pay | Admitting: Nurse Practitioner

## 2013-01-01 NOTE — Telephone Encounter (Signed)
APPT GIVEN FOR TOMORROW AT 8 AM

## 2013-01-02 ENCOUNTER — Ambulatory Visit (INDEPENDENT_AMBULATORY_CARE_PROVIDER_SITE_OTHER): Payer: BC Managed Care – PPO | Admitting: Nurse Practitioner

## 2013-01-02 ENCOUNTER — Encounter: Payer: Self-pay | Admitting: Nurse Practitioner

## 2013-01-02 VITALS — BP 148/80 | HR 97 | Temp 98.1°F | Ht 63.0 in | Wt 200.0 lb

## 2013-01-02 DIAGNOSIS — R0609 Other forms of dyspnea: Secondary | ICD-10-CM

## 2013-01-02 DIAGNOSIS — G2581 Restless legs syndrome: Secondary | ICD-10-CM

## 2013-01-02 DIAGNOSIS — J209 Acute bronchitis, unspecified: Secondary | ICD-10-CM

## 2013-01-02 MED ORDER — AMOXICILLIN 875 MG PO TABS: 875.0000 mg | ORAL_TABLET | Freq: Two times a day (BID) | ORAL | Status: DC

## 2013-01-02 MED ORDER — METHYLPREDNISOLONE ACETATE 80 MG/ML IJ SUSP
80.0000 mg | Freq: Once | INTRAMUSCULAR | Status: AC
  Administered 2013-01-02: 80 mg via INTRAMUSCULAR

## 2013-01-02 MED ORDER — PRAMIPEXOLE DIHYDROCHLORIDE 0.25 MG PO TABS: 0.5000 mg | ORAL_TABLET | Freq: Every evening | ORAL | Status: DC | PRN

## 2013-01-02 MED ORDER — HYDROCODONE-HOMATROPINE 5-1.5 MG/5ML PO SYRP: 5.0000 mL | ORAL_SOLUTION | Freq: Three times a day (TID) | ORAL | Status: DC | PRN

## 2013-01-02 NOTE — Progress Notes (Signed)
Subjective:    Patient ID: Caitlin Holmes, female    DOB: 1961/06/03, 52 y.o.   MRN: [id number]  HPI-Patient in complaining of cough . Started 14month. Has gotten worse since started. Associated symptoms include Bil ear pain and on exertion. He has tried lots OTC without relief.     Review of Systems  Constitutional: Positive for fever (intermittent). Negative for chills.  HENT: Positive for ear pain, congestion, rhinorrhea and sinus pressure. Negative for sore throat.   Respiratory: Positive for cough (nonproductive) and shortness of breath.   Cardiovascular: Negative.   Gastrointestinal: Negative.   Genitourinary: Negative.   Musculoskeletal: Negative.   Skin: Negative.   Psychiatric/Behavioral: Negative.        Objective:   Physical Exam  Constitutional: She is oriented to person, place, and time. She appears well-developed and well-nourished.  HENT:  Head: Normocephalic.  Right Ear: Hearing, tympanic membrane, external ear and ear canal normal.  Left Ear: Hearing, tympanic membrane, external ear and ear canal normal.  Nose: Rhinorrhea present. Right sinus exhibits maxillary sinus tenderness. Right sinus exhibits no frontal sinus tenderness. Left sinus exhibits maxillary sinus tenderness. Left sinus exhibits no frontal sinus tenderness.  Mouth/Throat: Mucous membranes are not pale. Posterior oropharyngeal erythema present.  Eyes: Conjunctivae are normal. Pupils are equal, round, and reactive to light.  Neck: Normal range of motion. Neck supple.  Cardiovascular: Normal rate, regular rhythm and normal heart sounds.   Pulmonary/Chest: Effort normal and breath sounds normal.  Abdominal: Soft. Bowel sounds are normal.  Neurological: She is alert and oriented to person, place, and time.  Skin: Skin is warm and dry.  Psychiatric: She has a normal mood and affect. Her behavior is normal. Thought content normal.   EKG-NSR BP 148/80  Pulse 97  Temp(Src) 98.1 F (36.7 C) (Oral)   Ht 5\' 3"  (1.6 m)  Wt 200 lb (90.719 kg)  BMI 35.44 kg/m2  LMP 12/02/2012        Assessment & Plan:  1. Restless leg syndrome Increased mirapex as indicated - pramipexole (MIRAPEX) 0.25 MG tablet; Take 2 tablets (0.5 mg total) by mouth at bedtime and may repeat dose one time if needed.  Dispense: 90 tablet; Refill: 1  2. DOE (dyspnea on exertion)  - EKG 12-Lead  3. Acute bronchitis 1. Take meds as prescribed 2. Use a cool mist humidifier especially during the winter months and when heat has  been humid. 3. Use saline nose sprays frequently 4. Saline irrigations of the nose can be very helpful if done frequently.  * 4X daily for 1 week*  * Use of a nettie pot can be helpful with this. Follow directions with this* 5. Drink plenty of fluids 6. Keep thermostat turn down low 7.For any cough or congestion  Use plain Mucinex- regular strength or max strength is fine   * Children- consult with Pharmacist for dosing 8. For fever or aces or pains- take tylenol or ibuprofen appropriate for age and weight.  * for fevers greater than 101 orally you may alternate ibuprofen and tylenol every  3 hours.   Mary-Margaret Daphine Deutscher, FNP  - amoxicillin (AMOXIL) 875 MG tablet; Take 1 tablet (875 mg total) by mouth 2 (two) times daily.  Dispense: 20 tablet; Refill: 0 - methylPREDNISolone acetate (DEPO-MEDROL) injection 80 mg; Inject 1 mL (80 mg total) into the muscle once. - HYDROcodone-homatropine (HYCODAN) 5-1.5 MG/5ML syrup; Take 5 mLs by mouth every 8 (eight) hours as needed for cough.  Dispense: 120 mL; Refill:  0  

## 2013-01-02 NOTE — Patient Instructions (Signed)

## 2013-01-05 ENCOUNTER — Telehealth: Payer: Self-pay | Admitting: Nurse Practitioner

## 2013-01-05 MED ORDER — AZITHROMYCIN 250 MG PO TABS: ORAL_TABLET | ORAL | Status: DC

## 2013-01-05 NOTE — Telephone Encounter (Signed)
zpak sent to pharmacy 

## 2013-01-05 NOTE — Telephone Encounter (Signed)
Pt advised to stop amoxicillin and try benadryl to help with the itching and we will give message to [facility] and see what she wants to do about a different abx.

## 2013-01-05 NOTE — Telephone Encounter (Signed)
Patient aware rx sent in  

## 2013-01-05 NOTE — Telephone Encounter (Signed)
Please advise 

## 2013-01-13 ENCOUNTER — Telehealth: Payer: Self-pay | Admitting: *Deleted

## 2013-01-13 MED ORDER — PREDNISONE 20 MG PO TABS: 20.0000 mg | ORAL_TABLET | Freq: Every day | ORAL | Status: DC

## 2013-01-13 NOTE — Telephone Encounter (Signed)
Cough is the same if not worse- chest is sore from cough- finished all zpak and hycodan syrup after amoxicillin had reaction. Wants to know what else she can do/or you call in for her to try Not a smoker- but passively around smoke. Pharm [facility] [place], [place] meds and allergies up to date.

## 2013-01-13 NOTE — Telephone Encounter (Signed)
Prednisone rx called in

## 2013-01-14 NOTE — Telephone Encounter (Signed)
Patient aware.

## 2013-01-15 ENCOUNTER — Other Ambulatory Visit: Payer: Self-pay | Admitting: Nurse Practitioner

## 2013-01-21 ENCOUNTER — Telehealth: Payer: Self-pay | Admitting: *Deleted

## 2013-01-21 NOTE — Telephone Encounter (Signed)
PT STILL HAS SORE CHEST, COUGH, FEELS LIKE SHE IS "SWOLLEN" IN HER CHEST, ALREADY TAKEN SEVERAL MEDS FOR THIS. SHE WANTS TO SEE A SPECIALIST FOR THIS PROBLEM  MMM TO ADDRESS

## 2013-01-21 NOTE — Telephone Encounter (Signed)
I ma not sure who she needs to see. Exactlly what symptoms is she having

## 2013-01-22 ENCOUNTER — Other Ambulatory Visit: Payer: Self-pay | Admitting: Nurse Practitioner

## 2013-01-22 DIAGNOSIS — R0602 Shortness of breath: Secondary | ICD-10-CM

## 2013-01-22 NOTE — Telephone Encounter (Signed)
What was documented is exatly how she described it. Do you think a pulmonary dr?

## 2013-01-22 NOTE — Telephone Encounter (Signed)
Pt aware ref is to be made

## 2013-01-22 NOTE — Telephone Encounter (Signed)
pulmonology referral made

## 2013-01-26 ENCOUNTER — Ambulatory Visit (INDEPENDENT_AMBULATORY_CARE_PROVIDER_SITE_OTHER): Payer: BC Managed Care – PPO | Admitting: Pulmonary Disease

## 2013-01-26 ENCOUNTER — Ambulatory Visit (INDEPENDENT_AMBULATORY_CARE_PROVIDER_SITE_OTHER)
Admission: RE | Admit: 2013-01-26 | Discharge: 2013-01-26 | Disposition: A | Discharge status: Home / Self Care | Payer: BC Managed Care – PPO | Source: Ambulatory Visit | Attending: Pulmonary Disease | Admitting: Pulmonary Disease

## 2013-01-26 ENCOUNTER — Encounter: Payer: Self-pay | Admitting: Pulmonary Disease

## 2013-01-26 VITALS — BP 142/80 | HR 82 | Temp 97.5°F | Ht 63.0 in | Wt 191.6 lb

## 2013-01-26 DIAGNOSIS — R05 Cough: Secondary | ICD-10-CM

## 2013-01-26 MED ORDER — OMEPRAZOLE 40 MG PO CPDR: 40.0000 mg | DELAYED_RELEASE_CAPSULE | Freq: Two times a day (BID) | ORAL | Status: DC

## 2013-01-26 MED ORDER — TRAMADOL HCL 50 MG PO TABS: ORAL_TABLET | ORAL | Status: DC

## 2013-01-26 NOTE — Progress Notes (Signed)
  Subjective:    Patient ID: Caitlin Holmes, female    DOB: 18-Jul-1961, 52 y.o.   MRN: [id number]  HPI Patient comes in today for reevaluation of chronic cough.  I had seen her in the distant past, and felt she had an upper airway cough.  I treated her for postnasal drip and laryngopharyngeal reflux, however she never returned.  She states the cough did improve at that time, but it was short lived.  Her current cough is dry in nature, and often results in cough paroxysms.  She denies any aggravating or alleviating factors.  She denies having a globus sensation, but her family member does hear her clearing her throat.  She is unsure about postnasal drip, but denies any issues with reflux disease.  She does have intermittent dyspnea at times, but no history of asthma.   Review of Systems  Constitutional: Negative for fever and unexpected weight change.  HENT: Positive for congestion. Negative for ear pain, nosebleeds, sore throat, rhinorrhea, sneezing, trouble swallowing, dental problem, postnasal drip and sinus pressure.   Eyes: Negative for redness and itching.  Respiratory: Positive for cough, choking, chest tightness, shortness of breath and wheezing. Negative for stridor.   Cardiovascular: Positive for chest pain. Negative for palpitations and leg swelling.  Gastrointestinal: Positive for nausea. Negative for vomiting.  Genitourinary: Negative for dysuria.  Musculoskeletal: Negative for joint swelling.  Skin: Negative for rash.  Neurological: Positive for light-headedness ( with coughing). Negative for headaches.  Hematological: Does not bruise/bleed easily.  Psychiatric/Behavioral: Negative for dysphoric mood. The patient is not nervous/anxious.        Objective:   Physical Exam Obese female in no acute distress Nose without purulence or discharge noted Neck without lymphadenopathy or thyromegaly Chest totally clear to auscultation, no wheezing Cardiac exam with regular rate and  rhythm Lower extremities without edema, no cyanosis Alert and oriented, moves all 4 extremities.       Assessment & Plan:

## 2013-01-26 NOTE — Assessment & Plan Note (Signed)
The patient has a chronic cough that most likely is upper airway in origin.  Her lungs are totally clear today, her spirometry is normal, and we have found no pulmonary issues in the past to explain her cough.  I would like to treat her for postnasal drip, laryngopharyngeal reflux, and cyclical coughing, and see if she has improvement.  I have had a long discussion with her about chronic cough, and difficulty we have diagnosing and treating.  I would like to see her back in 3 weeks to see how much progress has been made.  Will also check a chest x-ray today for completeness.

## 2013-01-26 NOTE — Patient Instructions (Addendum)
Will check a cxr today to see if any explanation for your cough in the chest.  Your breathing tests today are normal Omeprazole 40mg  in am and pm everyday no matter what until next visit in 3 weeks. Chlorpheniramine 4mg  over the counter, take 2 each night at bedtime and one each day at lunch until next visit. Tramadol 50mg  one every 12 hours during the day no matter what, but can take every 6hrs if having breakthru cough. Limit voice use as MUCH AS POSSIBLE.  No singing, excessive talking, yelling, etc. Use hard candy (no mint or methol/cough drops) from sunup to sundown to bathe the back of the throat and help prevent cough. NO throat clearing.  This leads to worsening cough.  followup with me in 3 weeks.

## 2013-02-11 ENCOUNTER — Other Ambulatory Visit: Payer: Self-pay | Admitting: Nurse Practitioner

## 2013-02-17 ENCOUNTER — Ambulatory Visit (INDEPENDENT_AMBULATORY_CARE_PROVIDER_SITE_OTHER): Payer: BC Managed Care – PPO | Admitting: Pulmonary Disease

## 2013-02-17 ENCOUNTER — Encounter: Payer: Self-pay | Admitting: Pulmonary Disease

## 2013-02-17 VITALS — BP 140/84 | HR 81 | Temp 98.6°F | Ht 63.0 in | Wt 184.4 lb

## 2013-02-17 DIAGNOSIS — R05 Cough: Secondary | ICD-10-CM

## 2013-02-17 NOTE — Progress Notes (Signed)
  Subjective:    Patient ID: Caitlin Holmes, female    DOB: [birth year], 52 y.o.   MRN: [id number]  HPI The patient comes in today for followup of her chronic cough.  At the last visit, she was felt to have an upper airway cough, and was treated with proton pump inhibitor, antihistamines, and behavioral therapies.  She comes in today where her cough is almost totally resolved, but she is having a lot of sinus dryness from the chlorpheniramine.   Review of Systems  Constitutional: Negative for fever and unexpected weight change.  HENT: Positive for congestion ( yellow-dark yellow, hard dried mucus at times and thick others) and rhinorrhea. Negative for ear pain, nosebleeds, sore throat, sneezing, trouble swallowing, dental problem, postnasal drip and sinus pressure.   Eyes: Negative for redness and itching.  Respiratory: Positive for shortness of breath. Negative for cough ( cough has improved), chest tightness and wheezing.   Cardiovascular: Negative for palpitations and leg swelling.  Gastrointestinal: Negative for nausea and vomiting.  Genitourinary: Negative for dysuria.  Musculoskeletal: Negative for joint swelling.  Skin: Negative for rash.  Neurological: Negative for headaches.  Hematological: Does not bruise/bleed easily.  Psychiatric/Behavioral: Negative for dysphoric mood. The patient is not nervous/anxious.        Objective:   Physical Exam Obese female no acute distress Nose without purulent discharge noted Neck without lymphadenopathy or thyromegaly Lower extremities without edema, cyanosis Alert and oriented, moves all 4 extremities.       Assessment & Plan:

## 2013-02-17 NOTE — Assessment & Plan Note (Signed)
The patient is greatly improved from a cough standpoint after treating for laryngopharyngeal reflux, postnasal drip, and irritable larynx syndrome.  I would like to decrease her proton pump inhibitor to once a day, and use her antihistamines just as needed.  I would like her to try and come off tramadol completely, but we'll give her a small supply in order to prevent re\re escalation.  She is to call me in approximately 4 weeks and give an update on how things are going.  I suspect this is going to be an ongoing process for her, and the key will be to prevent escalation to cyclical coughing.

## 2013-02-17 NOTE — Patient Instructions (Addendum)
Take chlorpheniramine only as needed rather than everyday.  Cut back to just one 4mg  tab when you do take it. Decrease omeprazole to once a day everyday. Try and stop using tramadol for cough as much as you can, so we can see how things are going.  Please call me in 4 weeks with an update on how things are going, but call sooner if your cough begins to escalate.

## 2013-03-16 ENCOUNTER — Other Ambulatory Visit: Payer: Self-pay

## 2013-03-16 DIAGNOSIS — G2581 Restless legs syndrome: Secondary | ICD-10-CM

## 2013-03-16 MED ORDER — PRAMIPEXOLE DIHYDROCHLORIDE 0.25 MG PO TABS: 0.5000 mg | ORAL_TABLET | Freq: Every evening | ORAL | Status: DC | PRN

## 2013-03-16 NOTE — Telephone Encounter (Signed)
Last seen 01/02/13   If approved call in and have nurse notify patient

## 2013-03-18 ENCOUNTER — Telehealth: Payer: Self-pay | Admitting: Nurse Practitioner

## 2013-03-18 DIAGNOSIS — G4733 Obstructive sleep apnea (adult) (pediatric): Secondary | ICD-10-CM

## 2013-03-18 NOTE — Telephone Encounter (Signed)
Referral made 

## 2013-03-19 NOTE — Telephone Encounter (Signed)
Pt aware referral in progress

## 2013-03-23 ENCOUNTER — Telehealth: Payer: Self-pay

## 2013-03-23 DIAGNOSIS — G4733 Obstructive sleep apnea (adult) (pediatric): Secondary | ICD-10-CM

## 2013-03-23 NOTE — Telephone Encounter (Signed)
Patient calling about getting a referral for sleep study   I see note about referral being done but it is in there as being cancelled  Can you put a split sleep study in as an order again please?

## 2013-03-23 NOTE — Telephone Encounter (Signed)
Referral re-done.

## 2013-03-24 ENCOUNTER — Other Ambulatory Visit (HOSPITAL_COMMUNITY): Payer: Self-pay

## 2013-03-24 DIAGNOSIS — G4733 Obstructive sleep apnea (adult) (pediatric): Secondary | ICD-10-CM

## 2013-04-02 ENCOUNTER — Other Ambulatory Visit: Payer: Self-pay

## 2013-04-02 MED ORDER — OXYBUTYNIN CHLORIDE 5 MG PO TABS: 5.0000 mg | ORAL_TABLET | Freq: Two times a day (BID) | ORAL | Status: DC

## 2013-04-06 ENCOUNTER — Telehealth: Payer: Self-pay | Admitting: Nurse Practitioner

## 2013-04-06 MED ORDER — ROPINIROLE HCL ER 4 MG PO TB24: 4.0000 mg | ORAL_TABLET | Freq: Every day | ORAL | Status: DC

## 2013-04-06 NOTE — Telephone Encounter (Signed)
Please advise 

## 2013-04-06 NOTE — Telephone Encounter (Signed)
Changed mirapex to requip- let me know if helps

## 2013-04-07 NOTE — Telephone Encounter (Signed)
Patient aware and will let us know if it works.

## 2013-04-10 ENCOUNTER — Ambulatory Visit: Payer: BC Managed Care – PPO | Attending: Family Medicine

## 2013-04-10 DIAGNOSIS — G4733 Obstructive sleep apnea (adult) (pediatric): Secondary | ICD-10-CM | POA: Insufficient documentation

## 2013-04-13 ENCOUNTER — Telehealth: Payer: Self-pay | Admitting: Nurse Practitioner

## 2013-04-13 NOTE — Procedures (Signed)
[facility] Julyana Woolverton A. Gerilyn Pilgrim, MD     [email or web address]        NAMEJULIANA, Caitlin Holmes                ACCOUNT NO.:  [id number]  MEDICAL RECORD NO.:  [id number]          PATIENT TYPE:  OUT  LOCATION:  SLEEP LAB                     FACILITY:  [facility]  PHYSICIAN:  Remigio Mcmillon A. Gerilyn Pilgrim, M.D. DATE OF BIRTH:  01/09/1961  DATE OF STUDY:                           NOCTURNAL POLYSOMNOGRAM  REFERRING PHYSICIAN:  MARY-MARGARET MARTIN  INDICATION:  This is a 52 year old who presents with snoring and daytime fatigue.  The study is being done to evaluate for obstructive sleep apnea syndrome.  MEDICATIONS:  Melatonin, Mirapex.  EPWORTH SLEEPINESS SCALE:  5.  BMI 33.  ARCHITECTURAL SUMMARY:  The total recording time is 426 minutes, sleep efficiency 83%.  Sleep latency 22 minutes.  REM latency 374 minutes, stage N1 is 4%, N2 is 69%, N3 is 21%, and REM sleep 7%.  RESPIRATORY SUMMARY:  Baseline oxygen saturation is 92, lowest saturation 80.  The patient was noted to have prolonged episodes of desaturations without obstructive events.  The diagnostic AHI is 14 and RDI also 14.  LIMB MOVEMENT SUMMARY:  PLM index 0.  ELECTROCARDIOGRAM SUMMARY:  Average heart rate is 74 with no significant dysrhythmias observed.  IMPRESSION: 1. Mild-to-moderate obstructive sleep apnea syndrome. 2. Hypoventilation syndrome.  RECOMMENDATION:  Formal CPAP titration recording.  Thanks for this referral.   Kristee Angus A. Gerilyn Pilgrim, M.D.    KAD/MEDQ  D:  04/13/2013 10:17:18  T:  04/13/2013 10:46:12  Job:  [id number]

## 2013-04-13 NOTE — Telephone Encounter (Signed)
Advise please.

## 2013-04-13 NOTE — Telephone Encounter (Signed)
i don't know why they don't carry it- will have nurse call pharmacy and find out what is going on

## 2013-04-14 NOTE — Telephone Encounter (Signed)
Spoke with pharmacy and it was ordered and it came in yest and it was ready for pick up. Patient aware.

## 2013-05-04 ENCOUNTER — Telehealth: Payer: Self-pay | Admitting: Nurse Practitioner

## 2013-05-04 ENCOUNTER — Other Ambulatory Visit: Payer: Self-pay | Admitting: Nurse Practitioner

## 2013-05-04 DIAGNOSIS — G4734 Idiopathic sleep related nonobstructive alveolar hypoventilation: Secondary | ICD-10-CM

## 2013-05-05 ENCOUNTER — Telehealth: Payer: Self-pay | Admitting: Nurse Practitioner

## 2013-05-05 NOTE — Telephone Encounter (Signed)
O2- she did not have in obstruction just desaturations

## 2013-05-05 NOTE — Telephone Encounter (Signed)
Has anyone discussed her sleep study with her? If not, please call her, let me know when to order? Not ordering until she gets info

## 2013-05-05 NOTE — Telephone Encounter (Signed)
Was she supposed to have O2 ordered or just a CPAP machine?

## 2013-05-05 NOTE — Telephone Encounter (Signed)
Notified patient of results. She understands. Please go ahead with O2 order

## 2013-05-13 ENCOUNTER — Other Ambulatory Visit: Payer: Self-pay

## 2013-05-13 DIAGNOSIS — G4733 Obstructive sleep apnea (adult) (pediatric): Secondary | ICD-10-CM

## 2013-05-15 NOTE — Telephone Encounter (Signed)
Called pt, waiting on MMM to sign orders on 05/18/13. Pt understands

## 2013-05-18 ENCOUNTER — Telehealth: Payer: Self-pay | Admitting: *Deleted

## 2013-05-18 ENCOUNTER — Other Ambulatory Visit: Payer: Self-pay | Admitting: Nurse Practitioner

## 2013-05-18 DIAGNOSIS — R0902 Hypoxemia: Secondary | ICD-10-CM

## 2013-05-18 NOTE — Telephone Encounter (Signed)
You ordered o2 on pt, her sleep study does not cover this, notes are on her chart and I sent it back to you.

## 2013-05-19 ENCOUNTER — Ambulatory Visit: Payer: BC Managed Care – PPO | Admitting: Family Medicine

## 2013-05-20 ENCOUNTER — Other Ambulatory Visit: Payer: Self-pay | Admitting: *Deleted

## 2013-05-20 ENCOUNTER — Telehealth: Payer: Self-pay | Admitting: *Deleted

## 2013-05-20 ENCOUNTER — Other Ambulatory Visit: Payer: Self-pay | Admitting: Nurse Practitioner

## 2013-05-20 DIAGNOSIS — R0902 Hypoxemia: Secondary | ICD-10-CM

## 2013-05-20 NOTE — Telephone Encounter (Signed)
error 

## 2013-05-25 ENCOUNTER — Telehealth: Payer: Self-pay | Admitting: *Deleted

## 2013-05-25 ENCOUNTER — Encounter: Payer: Self-pay | Admitting: *Deleted

## 2013-05-25 DIAGNOSIS — G4734 Idiopathic sleep related nonobstructive alveolar hypoventilation: Secondary | ICD-10-CM

## 2013-05-25 NOTE — Telephone Encounter (Signed)
This encounter was created in error - please disregard.

## 2013-05-25 NOTE — Telephone Encounter (Signed)
Ordered and email sent to [facility] at [facility]

## 2013-05-25 NOTE — Telephone Encounter (Signed)
Dont ask me what is going on but, They have done the overnight oximetry, and she does qualify for oxygen at night only at 2l/m. Please put the order in epic and notify Kara Mead

## 2013-05-29 ENCOUNTER — Emergency Department (HOSPITAL_COMMUNITY): Payer: BC Managed Care – PPO

## 2013-05-29 ENCOUNTER — Emergency Department (HOSPITAL_COMMUNITY)
Admission: EM | Admit: 2013-05-29 | Discharge: 2013-05-29 | Disposition: A | Discharge status: Home / Self Care | Payer: BC Managed Care – PPO | Attending: Emergency Medicine | Admitting: Emergency Medicine

## 2013-05-29 ENCOUNTER — Encounter (HOSPITAL_COMMUNITY): Payer: Self-pay | Admitting: *Deleted

## 2013-05-29 DIAGNOSIS — W19XXXA Unspecified fall, initial encounter: Secondary | ICD-10-CM

## 2013-05-29 DIAGNOSIS — Y9389 Activity, other specified: Secondary | ICD-10-CM | POA: Insufficient documentation

## 2013-05-29 DIAGNOSIS — F411 Generalized anxiety disorder: Secondary | ICD-10-CM | POA: Insufficient documentation

## 2013-05-29 DIAGNOSIS — Z862 Personal history of diseases of the blood and blood-forming organs and certain disorders involving the immune mechanism: Secondary | ICD-10-CM | POA: Insufficient documentation

## 2013-05-29 DIAGNOSIS — W010XXA Fall on same level from slipping, tripping and stumbling without subsequent striking against object, initial encounter: Secondary | ICD-10-CM | POA: Insufficient documentation

## 2013-05-29 DIAGNOSIS — F3289 Other specified depressive episodes: Secondary | ICD-10-CM | POA: Insufficient documentation

## 2013-05-29 DIAGNOSIS — F329 Major depressive disorder, single episode, unspecified: Secondary | ICD-10-CM | POA: Insufficient documentation

## 2013-05-29 DIAGNOSIS — G2581 Restless legs syndrome: Secondary | ICD-10-CM | POA: Insufficient documentation

## 2013-05-29 DIAGNOSIS — Y929 Unspecified place or not applicable: Secondary | ICD-10-CM | POA: Insufficient documentation

## 2013-05-29 DIAGNOSIS — I10 Essential (primary) hypertension: Secondary | ICD-10-CM | POA: Insufficient documentation

## 2013-05-29 DIAGNOSIS — S20229A Contusion of unspecified back wall of thorax, initial encounter: Secondary | ICD-10-CM | POA: Insufficient documentation

## 2013-05-29 DIAGNOSIS — Z8739 Personal history of other diseases of the musculoskeletal system and connective tissue: Secondary | ICD-10-CM | POA: Insufficient documentation

## 2013-05-29 DIAGNOSIS — I498 Other specified cardiac arrhythmias: Secondary | ICD-10-CM | POA: Insufficient documentation

## 2013-05-29 DIAGNOSIS — S300XXA Contusion of lower back and pelvis, initial encounter: Secondary | ICD-10-CM

## 2013-05-29 DIAGNOSIS — R3915 Urgency of urination: Secondary | ICD-10-CM | POA: Insufficient documentation

## 2013-05-29 MED ORDER — NAPROXEN 500 MG PO TABS: 500.0000 mg | ORAL_TABLET | Freq: Two times a day (BID) | ORAL | Status: DC

## 2013-05-29 MED ORDER — CYCLOBENZAPRINE HCL 10 MG PO TABS: 10.0000 mg | ORAL_TABLET | Freq: Two times a day (BID) | ORAL | Status: DC | PRN

## 2013-05-29 NOTE — ED Notes (Signed)
Dr. Adriana Simas at bedside, c-collar and back board removed by Dr. Adriana Simas.

## 2013-05-29 NOTE — ED Provider Notes (Signed)
CSN: [id number]     Arrival date & time 05/29/13  2132 History  This chart was scribed for Donnetta Hutching, MD by Shari Heritage, ED Scribe. The patient was seen in room [facility]10/[facility]10. Patient's care was started at 9:40 PM.    Chief Complaint  Patient presents with  . Back Pain  . Tailbone Pain  . Fall    The history is provided by the patient. No language interpreter was used.    HPI Comments: Caitlin Holmes is a 52 y.o. female who presents to the Emergency Department complaining of a fall that occurred 1 hour ago. She is now complaining of moderate, constant lower lumbar and sacral pain that began immediately following the fall. Patient states that she was at [facility] in [place] looking for someone to take her order when she stepped back and slipped on a recently mopped floor.  She was transported to the ED via EMS and was laced on a backboard with C-collar. She denies any other injuries at this time. She has a medical history of HTN, OSA, sinus tachycardia.   Past Medical History  Diagnosis Date  . Hypertension   . Allergic rhinitis   . Chronic headache   . OSA (obstructive sleep apnea)   . Anemia   . Arthritis   . Depression   . Restless leg syndrome   . Anxiety   . Urinary urgency   . Sinus tachycardia   . Hemorrhoids    Past Surgical History  Procedure Laterality Date  . Nasal septum surgery  1999  . Uvulopalatopharyngoplasty, tonsillectomy and septoplasty  1999    Dr. Clearance Coots at [facility]  . Rotator cuff repair      Right  . Meniscus repair      Right   Family History  Problem Relation Age of Onset  . Emphysema Mother   . Allergies Mother   . Asthma Mother   . Emphysema Maternal Grandmother   . Colon cancer Maternal Grandmother   . Cancer Maternal Grandmother     colon  . Stroke Maternal Grandmother   . Allergies Father   . Prostate cancer Father   . Allergies Brother   . Allergies Sister   . Hypertension Sister   . Diabetes Paternal Grandmother    History   Substance Use Topics  . Smoking status: Never Smoker   . Smokeless tobacco: Never Used  . Alcohol Use: Yes   OB History   Grav Para Term Preterm Abortions TAB SAB Ect Mult Living                 Review of Systems A complete 10 system review of systems was obtained and all systems are negative except as noted in the HPI and PMH.   Allergies  Advair diskus; Amoxicillin; Lithium; Nasonex; Sertraline hcl; and Zoloft  Home Medications   Current Outpatient Rx  Name  Route  Sig  Dispense  Refill  . amphetamine-dextroamphetamine (ADDERALL XR) 20 MG 24 hr capsule               . calcium citrate-vitamin D (CITRACAL+D) 315-200 MG-UNIT per tablet   Oral   Take 1 tablet by mouth 2 (two) times daily.           . chlorpheniramine (CHLOR-TRIMETON) 4 MG tablet      Take 2 at bedtime and 1 at lunch.         . clonazePAM (KLONOPIN) 1 MG tablet   Oral   Take 1  mg by mouth 2 (two) times daily as needed for anxiety.         . cycloSPORINE (RESTASIS) 0.05 % ophthalmic emulsion      2 drops 2 (two) times daily.         Marland Kitchen escitalopram (LEXAPRO) 20 MG tablet               . HYDROcodone-homatropine (HYCODAN) 5-1.5 MG/5ML syrup   Oral   Take 5 mLs by mouth every 8 (eight) hours as needed for cough.   120 mL   0   . metoprolol succinate (TOPROL-XL) 50 MG 24 hr tablet      TAKE ONE TABLET BY MOUTH EVERY DAY   90 tablet   1   . omeprazole (PRILOSEC) 40 MG capsule   Oral   Take 40 mg by mouth 2 (two) times daily.         Marland Kitchen oxybutynin (DITROPAN) 5 MG tablet   Oral   Take 1 tablet (5 mg total) by mouth 2 (two) times daily.   60 tablet   1   . rOPINIRole (REQUIP XL) 4 MG 24 hr tablet   Oral   Take 1 tablet (4 mg total) by mouth at bedtime.   30 tablet   1   . traMADol (ULTRAM) 50 MG tablet      Take 1 tab every 12 hrs daily for cough. Can take every 6 hours PRN increased cough.   60 tablet   0    Triage Vitals: BP 149/61  Pulse 92  Temp(Src) 98.6 F (37  C) (Oral)  Resp 22  Ht 5\' 3"  (1.6 m)  Wt 300 lb (136.079 kg)  BMI 53.16 kg/m2  SpO2 97%  LMP 05/17/2013 Physical Exam  Nursing note and vitals reviewed. Constitutional: She is oriented to person, place, and time. She appears well-developed and well-nourished.  HENT:  Head: Normocephalic and atraumatic.  Eyes: Conjunctivae and EOM are normal. Pupils are equal, round, and reactive to light.  Neck: Normal range of motion. Neck supple.  Cardiovascular: Normal rate, regular rhythm and normal heart sounds.   Pulmonary/Chest: Effort normal and breath sounds normal.  Abdominal: Soft. Bowel sounds are normal.  Musculoskeletal: Normal range of motion.  Tenderness to lower lumbar region, sacrum and coccyx.  Neurological: She is alert and oriented to person, place, and time.  Skin: Skin is warm and dry.  Psychiatric: She has a normal mood and affect.    ED Course  Procedures (including critical care time) DIAGNOSTIC STUDIES: Oxygen Saturation is 97% on room air, adequate by my interpretation.    COORDINATION OF CARE: 9:53 PM- Patient informed of current plan for treatment and evaluation and agrees with plan at this time.    Imaging Review Dg Lumbar Spine Complete  05/29/2013   CLINICAL DATA:  Pain post fall.  EXAM: LUMBAR SPINE - COMPLETE 4+ VIEW  COMPARISON:  09/09/2009  FINDINGS: Transitional lumbosacral segment . There is no evidence of lumbar spine fracture. Alignment is normal. Intervertebral disc spaces are maintained.  IMPRESSION: Negative.   Electronically Signed   By: Oley Balm M.D.   On: 05/29/2013 22:55   Dg Sacrum/coccyx  05/29/2013   CLINICAL DATA:  Pain post fall.  EXAM: SACRUM AND COCCYX - 2+ VIEW  COMPARISON:  09/09/2009  FINDINGS: Transitional lumbosacral segment. Negative for fracture, dislocation, or other acute bone abnormality. Arcuate lines intact. Normal mineralization and alignment.  IMPRESSION: Negative.   Electronically Signed   By: Oley Balm  M.D.    On: 05/29/2013 22:53    MDM  No diagnosis found. Plain films of lumbar and sacrum/coccyx negative for fracture.  No head or neck trauma I personally performed the services described in this documentation, which was scribed in my presence. The recorded information has been reviewed and is accurate.    Donnetta Hutching, MD 05/30/13 [phone]

## 2013-05-29 NOTE — ED Notes (Signed)
Pt was at [facility] in [place] and was leaning over the counter to look for a cashier and there was a woman behind her mopping the floor. Pt stepped back and slipped in the floor. Pt c/o back pain and tail bone pain. Pt came in sideways on lsb and c-collar in place. Pt would not lay flat on the lsb. EDP Dr. Adriana Simas removed pt from lsb and removed c-collar.

## 2013-06-08 ENCOUNTER — Encounter (INDEPENDENT_AMBULATORY_CARE_PROVIDER_SITE_OTHER): Payer: Self-pay

## 2013-06-09 ENCOUNTER — Other Ambulatory Visit (INDEPENDENT_AMBULATORY_CARE_PROVIDER_SITE_OTHER): Payer: BC Managed Care – PPO

## 2013-06-09 DIAGNOSIS — E538 Deficiency of other specified B group vitamins: Secondary | ICD-10-CM

## 2013-06-09 DIAGNOSIS — M810 Age-related osteoporosis without current pathological fracture: Secondary | ICD-10-CM

## 2013-06-09 DIAGNOSIS — R5381 Other malaise: Secondary | ICD-10-CM

## 2013-06-09 DIAGNOSIS — G4733 Obstructive sleep apnea (adult) (pediatric): Secondary | ICD-10-CM

## 2013-06-09 DIAGNOSIS — R51 Headache: Secondary | ICD-10-CM

## 2013-06-09 LAB — FERRITIN: Ferritin: 10 ng/mL (ref 10–291)

## 2013-06-09 LAB — TSH: TSH: 6.434 u[IU]/mL — ABNORMAL HIGH (ref 0.350–4.500)

## 2013-06-09 LAB — VITAMIN B12: Vitamin B-12: 517 pg/mL (ref 211–911)

## 2013-06-10 ENCOUNTER — Encounter: Payer: Self-pay | Admitting: Nurse Practitioner

## 2013-06-10 ENCOUNTER — Telehealth: Payer: Self-pay | Admitting: Nurse Practitioner

## 2013-06-10 ENCOUNTER — Ambulatory Visit (INDEPENDENT_AMBULATORY_CARE_PROVIDER_SITE_OTHER): Payer: BC Managed Care – PPO | Admitting: Nurse Practitioner

## 2013-06-10 VITALS — BP 151/85 | HR 107 | Temp 99.3°F | Ht 63.0 in | Wt 200.0 lb

## 2013-06-10 DIAGNOSIS — S300XXD Contusion of lower back and pelvis, subsequent encounter: Secondary | ICD-10-CM

## 2013-06-10 DIAGNOSIS — R609 Edema, unspecified: Secondary | ICD-10-CM

## 2013-06-10 DIAGNOSIS — Z5189 Encounter for other specified aftercare: Secondary | ICD-10-CM

## 2013-06-10 DIAGNOSIS — R600 Localized edema: Secondary | ICD-10-CM

## 2013-06-10 MED ORDER — TRAMADOL HCL 50 MG PO TABS: 50.0000 mg | ORAL_TABLET | Freq: Three times a day (TID) | ORAL | Status: DC | PRN

## 2013-06-10 MED ORDER — FUROSEMIDE 20 MG PO TABS: 20.0000 mg | ORAL_TABLET | Freq: Every day | ORAL | Status: DC

## 2013-06-10 NOTE — Progress Notes (Signed)
  Subjective:    Patient ID: Caitlin Holmes, female    DOB: 1961/03/02, 52 y.o.   MRN: [id number]  HPI  Patient in c/o bil foot swelling- noticed it last night prior to bedtime- woke up this morning an dthey were still [facility] she fell on her tail bone last week and was given naproxyn and flexeril.    Review of Systems  Constitutional: Negative.   HENT: Negative.   Respiratory: Negative for shortness of breath.   Cardiovascular: Positive for leg swelling. Negative for chest pain.  Genitourinary: Negative.        Objective:   Physical Exam  Constitutional: She appears well-developed and well-nourished.  Cardiovascular: Normal rate, regular rhythm and normal heart sounds.   Pulmonary/Chest: Effort normal and breath sounds normal.  Musculoskeletal: Edema: 1+ right leg and foot- mild edema of left foot.  Skin: Skin is warm. No rash noted. No erythema. No pallor.    BP 151/85  Pulse 107  Temp(Src) 99.3 F (37.4 C) (Oral)  Ht 5\' 3"  (1.6 m)  Wt 200 lb (90.719 kg)  BMI 35.44 kg/m2  LMP 05/17/2013       Assessment & Plan:   1. Edema of both legs   2. Coccyx contusion, subsequent encounter    Meds ordered this encounter  Medications  . furosemide (LASIX) 20 MG tablet    Sig: Take 1 tablet (20 mg total) by mouth daily.    Dispense:  30 tablet    Refill:  3    Order Specific Question:  Supervising Provider    Answer:  Ernestina Penna [[id number]]  . traMADol (ULTRAM) 50 MG tablet    Sig: Take 1 tablet (50 mg total) by mouth every 8 (eight) hours as needed for pain.    Dispense:  30 tablet    Refill:  0    Order Specific Question:  Supervising Provider    Answer:  Ernestina Penna [[id number]]   Get a donut ring to sit on- or sit on pillow Elevate legs when sitting Stop naproxyn If not improving RTO  Caitlin Daphine Deutscher, FNP

## 2013-06-10 NOTE — Patient Instructions (Signed)

## 2013-06-10 NOTE — Telephone Encounter (Signed)
Appt given for today 

## 2013-06-22 ENCOUNTER — Encounter (INDEPENDENT_AMBULATORY_CARE_PROVIDER_SITE_OTHER): Payer: Self-pay | Admitting: General Surgery

## 2013-06-22 ENCOUNTER — Ambulatory Visit (INDEPENDENT_AMBULATORY_CARE_PROVIDER_SITE_OTHER): Payer: BC Managed Care – PPO | Admitting: General Surgery

## 2013-06-22 VITALS — BP 138/66 | HR 88 | Temp 97.4°F | Ht 63.0 in | Wt 200.2 lb

## 2013-06-22 DIAGNOSIS — K648 Other hemorrhoids: Secondary | ICD-10-CM

## 2013-06-22 DIAGNOSIS — K602 Anal fissure, unspecified: Secondary | ICD-10-CM

## 2013-06-22 MED ORDER — HYDROCORTISONE ACETATE 25 MG RE SUPP: 25.0000 mg | Freq: Two times a day (BID) | RECTAL | Status: DC

## 2013-06-22 MED ORDER — OXYCODONE-ACETAMINOPHEN 10-325 MG PO TABS: 1.0000 | ORAL_TABLET | Freq: Four times a day (QID) | ORAL | Status: DC | PRN

## 2013-06-22 NOTE — Progress Notes (Signed)
The patient comes in with complaints of perianal pain. She has a prior history of a period of fissure which was treated conservatively, however the patient was dissatisfied with the care.  On examination today the patient has a fissure at approximately the 7:00 position with 12:00 being directly posterior. She also has some stage II hemorrhoids.  I would treat the patient with 10 days of Anusol HC Suppository is also some diltiazem cream. Have also written her prescriptions for him for some Percocet tablets. She is return to see me in 2 weeks. At that time we will reassess her for the possibility of surgery which would include a possible lateral sphincterotomy.

## 2013-06-24 ENCOUNTER — Telehealth: Payer: Self-pay | Admitting: Nurse Practitioner

## 2013-06-24 DIAGNOSIS — R600 Localized edema: Secondary | ICD-10-CM

## 2013-06-25 MED ORDER — FUROSEMIDE 20 MG PO TABS: 20.0000 mg | ORAL_TABLET | Freq: Two times a day (BID) | ORAL | Status: DC

## 2013-06-25 NOTE — Telephone Encounter (Signed)
Left details. Call to confirm receiving message.

## 2013-06-25 NOTE — Telephone Encounter (Signed)
What can she do for swollen feet?

## 2013-06-25 NOTE — Telephone Encounter (Addendum)
Increase lasix to 1 in morning and one at 1 pm daily-elevate legs when sitting- let me know if helps. rx sent o pharmacy for increase dose

## 2013-06-29 ENCOUNTER — Telehealth: Payer: Self-pay | Admitting: Nurse Practitioner

## 2013-06-29 NOTE — Telephone Encounter (Signed)
ok 

## 2013-06-30 ENCOUNTER — Other Ambulatory Visit (HOSPITAL_COMMUNITY): Payer: Self-pay

## 2013-06-30 DIAGNOSIS — G4730 Sleep apnea, unspecified: Secondary | ICD-10-CM

## 2013-07-01 ENCOUNTER — Ambulatory Visit: Payer: BC Managed Care – PPO | Attending: Neurology | Admitting: Sleep Medicine

## 2013-07-01 DIAGNOSIS — Z6833 Body mass index (BMI) 33.0-33.9, adult: Secondary | ICD-10-CM | POA: Insufficient documentation

## 2013-07-01 DIAGNOSIS — G4733 Obstructive sleep apnea (adult) (pediatric): Secondary | ICD-10-CM | POA: Insufficient documentation

## 2013-07-01 DIAGNOSIS — G4730 Sleep apnea, unspecified: Secondary | ICD-10-CM

## 2013-07-01 NOTE — Telephone Encounter (Signed)
She can see chiropractor if she wants to they may help they may not

## 2013-07-02 NOTE — Telephone Encounter (Signed)
patient aware

## 2013-07-02 NOTE — Telephone Encounter (Signed)
Patient is aware 

## 2013-07-02 NOTE — Procedures (Signed)
[facility] Caitlin Holmes A. Caitlin Pilgrim, MD     [email or web address]        [name], [name]                ACCOUNT NO.:  [id number]  MEDICAL RECORD NO.:  [id number]          PATIENT TYPE:  OUT  LOCATION:  SLEEP LAB                     FACILITY:  [facility]  PHYSICIAN:  Francesco Provencal A. Caitlin Holmes, M.D. DATE OF BIRTH:  11/26/1960  DATE OF STUDY:  07/01/2013                           NOCTURNAL POLYSOMNOGRAM  REFERRING PHYSICIAN:  Alizza Sacra A. Caitlin Holmes, M.D.  INDICATION:  A 52 year old lady who has a sleep study documented with obstructive sleep apnea syndrome.  This is a CPAP titration recording.  INDICATION FOR STUDY:  EPWORTH SLEEPINESS SCORE:  MEDICATIONS:  Colace, temazepam, Restasis, Lexapro, Toprol, Ditropan, Metamucil, Requip, Preparation H, diltiazem, Lasix, melatonin.  EPWORTH SLEEPINESS SCALE:  15.  BMI:  33.  ARCHITECTURAL SUMMARY:  The total recording time 368 minutes.  Sleep efficiency 76%.  Sleep latency 20 minutes.  REM latency 155 minutes. Stage N1 is 2.5%, N2 76%, N3 60%, and REM sleep 15%  RESPIRATORY SUMMARY:  Baseline oxygen saturation is 96, lowest saturation 88 during REM sleep.  The patient was placed on positive pressure starting at 5 and increased to 6, optimal pressure 6 with resolution of obstructive events and good tolerance.  LIMB MOVEMENT SUMMARY:  PLM index is 0.  ELECTROCARDIOGRAM SUMMARY:  Average heart rate is 77 with no significant dysrhythmias observed.  IMPRESSION:  Obstructive sleep apnea syndrome, which responds well to a CPAP of 6.      Jshon Ibe A. Caitlin Holmes, M.D.    KAD/MEDQ  D:  07/02/2013 45:40:98  T:  07/02/2013 09:39:49  Job:  [id number]

## 2013-07-06 ENCOUNTER — Other Ambulatory Visit: Payer: Self-pay

## 2013-07-06 MED ORDER — OXYBUTYNIN CHLORIDE 5 MG PO TABS: 5.0000 mg | ORAL_TABLET | Freq: Two times a day (BID) | ORAL | Status: DC

## 2013-07-08 ENCOUNTER — Other Ambulatory Visit: Payer: Self-pay | Admitting: *Deleted

## 2013-07-08 MED ORDER — ROPINIROLE HCL ER 4 MG PO TB24: 4.0000 mg | ORAL_TABLET | Freq: Every day | ORAL | Status: DC

## 2013-07-08 NOTE — Telephone Encounter (Signed)
LAST OV 06/10/13.

## 2013-07-15 ENCOUNTER — Ambulatory Visit (INDEPENDENT_AMBULATORY_CARE_PROVIDER_SITE_OTHER): Payer: BC Managed Care – PPO | Admitting: General Surgery

## 2013-07-15 ENCOUNTER — Encounter (INDEPENDENT_AMBULATORY_CARE_PROVIDER_SITE_OTHER): Payer: Self-pay | Admitting: General Surgery

## 2013-07-15 VITALS — BP 142/78 | HR 76 | Resp 20 | Ht 63.0 in | Wt 197.0 lb

## 2013-07-15 DIAGNOSIS — K602 Anal fissure, unspecified: Secondary | ICD-10-CM

## 2013-07-15 NOTE — Progress Notes (Signed)
The patient comes in today stating that because of some diarrhea over the last 24 hours that her symptoms are worse. She feels as though there is something protruding from her rectum or blocking her right. She's had no additional blood continues to have discomfort.  The patient has refilled her diltiazem cream. She is on her second bottle of that now. She's run out of Anusol a.c. Suppositories.  On examination today externally you can still see the seizure at approximately the 7:00 position. On anoscopic examination her into her hemorrhoids appear to be much better. Her fissure has not healed very much at all and is starting to develop some hypertrophic tissue surrounding the edges.  All treat the patient for another 3 weeks with the topical diltiazem however at that time she is not heal we will schedule her for an examination under anesthesia along with the botulism injection. He'll see the patient back in approximately 3 weeks.

## 2013-07-16 ENCOUNTER — Other Ambulatory Visit: Payer: Self-pay | Admitting: Family Medicine

## 2013-07-24 ENCOUNTER — Telehealth: Payer: Self-pay | Admitting: Nurse Practitioner

## 2013-07-24 NOTE — Telephone Encounter (Signed)
appt given for 11/18 at 9:00 with Caitlin Holmes

## 2013-07-28 ENCOUNTER — Ambulatory Visit (INDEPENDENT_AMBULATORY_CARE_PROVIDER_SITE_OTHER): Payer: BC Managed Care – PPO | Admitting: Nurse Practitioner

## 2013-07-28 ENCOUNTER — Encounter: Payer: Self-pay | Admitting: Nurse Practitioner

## 2013-07-28 VITALS — BP 137/78 | HR 94 | Temp 99.1°F | Ht 63.0 in | Wt 198.0 lb

## 2013-07-28 DIAGNOSIS — L708 Other acne: Secondary | ICD-10-CM

## 2013-07-28 DIAGNOSIS — G2581 Restless legs syndrome: Secondary | ICD-10-CM

## 2013-07-28 DIAGNOSIS — M25569 Pain in unspecified knee: Secondary | ICD-10-CM

## 2013-07-28 DIAGNOSIS — L700 Acne vulgaris: Secondary | ICD-10-CM

## 2013-07-28 MED ORDER — TRETINOIN 0.05 % EX CREA: TOPICAL_CREAM | Freq: Every day | CUTANEOUS | Status: DC

## 2013-07-28 MED ORDER — GABAPENTIN 300 MG PO CAPS: 300.0000 mg | ORAL_CAPSULE | Freq: Two times a day (BID) | ORAL | Status: DC

## 2013-07-28 NOTE — Progress Notes (Signed)
  Subjective:    Patient ID: Caitlin Holmes, female    DOB: 03-24-1961, 52 y.o.   MRN: [id number]  HPI Patient in c/o RLS- use to be on mirapex and we switched to requip XL it helps wit movement but she says that her legs just hurt and ache all night long- She says that her hands hurt and are swoll en in the morning. Patient is currently on ultram for pain.  * Hx of acne- cystic like lesions- uses retin-A but is currently out of meds.  Review of Systems  All other systems reviewed and are negative.       Objective:   Physical Exam  Constitutional: She appears well-developed and well-nourished.  Cardiovascular: Normal rate, regular rhythm and normal heart sounds.   Pulmonary/Chest: Effort normal and breath sounds normal.  Abdominal: Soft. Bowel sounds are normal.  Musculoskeletal:  FROM of bil knees with c/o pain with any movement- No effusion or edema. Bil hands appear slightly swollen.  Skin: Skin is warm.  Multiple closed conedomes on chin and nose  Psychiatric: She has a normal mood and affect. Her behavior is normal. Judgment and thought content normal.    BP 137/78  Pulse 94  Temp(Src) 99.1 F (37.3 C) (Oral)  Ht 5\' 3"  (1.6 m)  Wt 198 lb (89.812 kg)  BMI 35.08 kg/m2       Assessment & Plan:   1. Acne vulgaris   2. Pain in joint, lower leg, unspecified laterality   3. RLS (restless legs syndrome)    Meds ordered this encounter  Medications  . gabapentin (NEURONTIN) 300 MG capsule    Sig: Take 1 capsule (300 mg total) by mouth 2 (two) times daily.    Dispense:  60 capsule    Refill:  1    Order Specific Question:  Supervising Provider    Answer:  Ernestina Penna [[id number]]  . tretinoin (RETIN-A) 0.05 % cream    Sig: Apply topically at bedtime.    Dispense:  45 g    Refill:  0    Order Specific Question:  Supervising Provider    Answer:  Ernestina Penna [[id number]]    May need referral to ortho or rheumatologist if continues Will not increase pain meds- will need  referral to pain management if continues Referral to dermatology if acne continues  Mary-Margaret Daphine Deutscher, FNP

## 2013-07-28 NOTE — Patient Instructions (Signed)
Restless Legs Syndrome Restless legs syndrome is a movement disorder. It may also be called a sensori-motor disorder.  CAUSES  No one knows what specifically causes restless legs syndrome, but it tends to run in families. It is also more common in people with low iron, in pregnancy, in people who need dialysis, and those with nerve damage (neuropathy).Some medications may make restless legs syndrome worse.Those medications include drugs to treat high blood pressure, some heart conditions, nausea, colds, allergies, and depression. SYMPTOMS Symptoms include uncomfortable sensations in the legs. These leg sensations are worse during periods of inactivity or rest. They are also worse while sitting or lying down. Individuals that have the disorder describe sensations in the legs that feel like:  Pulling.  Drawing.  Crawling.  Worming.  Boring.  Tingling.  Pins and needles.  Prickling.  Pain. The sensations are usually accompanied by an overwhelming urge to move the legs. Sudden muscle jerks may also occur. Movement provides temporary relief from the discomfort. In rare cases, the arms may also be affected. Symptoms may interfere with going to sleep (sleep onset insomnia). Restless legs syndrome may also be related to periodic limb movement disorder (PLMD). PLMD is another more common motor disorder. It also causes interrupted sleep. The symptoms from PLMD usually occur most often when you are awake. TREATMENT  Treatment for restless legs syndrome is symptomatic. This means that the symptoms are treated.   Massage and cold compresses may provide temporary relief.  Walk, stretch, or take a cold or hot bath.  Get regular exercise and a good night's sleep.  Avoid caffeine, alcohol, nicotine, and medications that can make it worse.  Do activities that provide mental stimulation like discussions, needlework, and video games. These may be helpful if you are not able to walk or  stretch. Some medications are effective in relieving the symptoms. However, many of these medications have side effects. Ask your caregiver about medications that may help your symptoms. Correcting iron deficiency may improve symptoms for some patients. Document Released: 08/17/2002 Document Revised: 11/19/2011 Document Reviewed: 11/23/2010 Exit[employer] Patient Information 2014 [employer].  

## 2013-08-03 ENCOUNTER — Telehealth: Payer: Self-pay | Admitting: Nurse Practitioner

## 2013-08-03 NOTE — Telephone Encounter (Signed)
Advise

## 2013-08-03 NOTE — Telephone Encounter (Signed)
Have patient come in to repeat labs and we will decide what to do

## 2013-08-04 NOTE — Telephone Encounter (Signed)
Spoke with patient.

## 2013-08-05 ENCOUNTER — Other Ambulatory Visit: Payer: BC Managed Care – PPO

## 2013-08-05 ENCOUNTER — Other Ambulatory Visit (INDEPENDENT_AMBULATORY_CARE_PROVIDER_SITE_OTHER): Payer: BC Managed Care – PPO

## 2013-08-05 ENCOUNTER — Encounter (INDEPENDENT_AMBULATORY_CARE_PROVIDER_SITE_OTHER): Payer: Self-pay

## 2013-08-05 DIAGNOSIS — E039 Hypothyroidism, unspecified: Secondary | ICD-10-CM

## 2013-08-05 NOTE — Progress Notes (Signed)
Pt came in for labs only 

## 2013-08-06 LAB — THYROID PANEL WITH TSH
T3 Uptake Ratio: 27 % (ref 24–39)
T4, Total: 6.8 ug/dL (ref 4.5–12.0)
TSH: 3.6 u[IU]/mL (ref 0.450–4.500)

## 2013-08-11 ENCOUNTER — Encounter (INDEPENDENT_AMBULATORY_CARE_PROVIDER_SITE_OTHER): Payer: BC Managed Care – PPO | Admitting: General Surgery

## 2013-08-11 ENCOUNTER — Other Ambulatory Visit: Payer: Self-pay | Admitting: Nurse Practitioner

## 2013-08-11 MED ORDER — TRETINOIN 0.05 % EX CREA: TOPICAL_CREAM | Freq: Every day | CUTANEOUS | Status: AC

## 2013-08-17 ENCOUNTER — Telehealth: Payer: Self-pay | Admitting: Nurse Practitioner

## 2013-08-17 DIAGNOSIS — R21 Rash and other nonspecific skin eruption: Secondary | ICD-10-CM

## 2013-08-17 NOTE — Telephone Encounter (Signed)
Referral made 

## 2013-08-18 ENCOUNTER — Telehealth: Payer: Self-pay | Admitting: Nurse Practitioner

## 2013-08-18 NOTE — Telephone Encounter (Signed)
PT TO CALL AND VERIFY W/ HER INS IF SHE NEED REFERRAL FROM PCP FOR DERMATOLOGIST OR CAN SHE MAKE HER OWN APPT.  IF SHE  CAN MAKE HER OWN APPT- SHE WILL CALL DERMATOLOGY HERSELF.  RS

## 2013-08-18 NOTE — Telephone Encounter (Signed)
Patient aware.

## 2013-08-19 ENCOUNTER — Other Ambulatory Visit: Payer: Self-pay | Admitting: Nurse Practitioner

## 2013-08-19 DIAGNOSIS — R21 Rash and other nonspecific skin eruption: Secondary | ICD-10-CM

## 2013-08-19 NOTE — Telephone Encounter (Signed)
Review message for FYI.

## 2013-08-20 ENCOUNTER — Telehealth: Payer: Self-pay | Admitting: Nurse Practitioner

## 2013-08-21 NOTE — Telephone Encounter (Signed)
Patient is waiting on referral for dermatology. She wants the 1st available wherever you can get it

## 2013-09-15 ENCOUNTER — Ambulatory Visit (INDEPENDENT_AMBULATORY_CARE_PROVIDER_SITE_OTHER): Payer: BC Managed Care – PPO

## 2013-09-15 ENCOUNTER — Encounter: Payer: Self-pay | Admitting: Family Medicine

## 2013-09-15 ENCOUNTER — Ambulatory Visit (INDEPENDENT_AMBULATORY_CARE_PROVIDER_SITE_OTHER): Payer: BC Managed Care – PPO | Admitting: Family Medicine

## 2013-09-15 VITALS — BP 156/85 | HR 112 | Temp 99.2°F | Ht 63.0 in | Wt 208.0 lb

## 2013-09-15 DIAGNOSIS — R059 Cough, unspecified: Secondary | ICD-10-CM

## 2013-09-15 DIAGNOSIS — R079 Chest pain, unspecified: Secondary | ICD-10-CM

## 2013-09-15 DIAGNOSIS — R0602 Shortness of breath: Secondary | ICD-10-CM

## 2013-09-15 DIAGNOSIS — J189 Pneumonia, unspecified organism: Secondary | ICD-10-CM

## 2013-09-15 DIAGNOSIS — R05 Cough: Secondary | ICD-10-CM

## 2013-09-15 MED ORDER — HYDROCODONE-ACETAMINOPHEN 5-325 MG PO TABS: 1.0000 | ORAL_TABLET | Freq: Four times a day (QID) | ORAL | Status: DC | PRN

## 2013-09-15 MED ORDER — LEVOFLOXACIN 500 MG PO TABS: 500.0000 mg | ORAL_TABLET | Freq: Every day | ORAL | Status: DC

## 2013-09-15 MED ORDER — METHYLPREDNISOLONE (PAK) 4 MG PO TABS: ORAL_TABLET | ORAL | Status: DC

## 2013-09-15 MED ORDER — LEVALBUTEROL HCL 1.25 MG/0.5ML IN NEBU: 1.2500 mg | INHALATION_SOLUTION | Freq: Once | RESPIRATORY_TRACT | Status: DC

## 2013-09-15 MED ORDER — CEFTRIAXONE SODIUM 1 G IJ SOLR: 1.0000 g | Freq: Once | INTRAMUSCULAR | Status: DC

## 2013-09-15 MED ORDER — METHYLPREDNISOLONE ACETATE 80 MG/ML IJ SUSP: 80.0000 mg | Freq: Once | INTRAMUSCULAR | Status: DC

## 2013-09-15 NOTE — Progress Notes (Signed)
   Subjective:    Patient ID: West BaliJoyce A Ollinger, female    DOB: [birth year], 53 y.o.   MRN: [id number]  HPI This 53 y.o. female presents for evaluation of cough, and URI sx's for over a week. Patient c/o cough and shortness of breath.   Review of Systems No chest pain, SOB, HA, dizziness, vision change, N/V, diarrhea, constipation, dysuria, urinary urgency or frequency, myalgias, arthralgias or rash.     Objective:   Physical Exam Filed Vitals:   09/15/13 1010  BP: 156/85  Pulse: 112  Temp: 99.2 F (37.3 C)  TempSrc: Oral  Height: 5\' 3"  (1.6 m)  Weight: 208 lb (94.348 kg)  SpO2: 94%   Vital signs noted  Well developed well nourished female.  HEENT - Head atraumatic Normocephalic                Eyes - PERRLA, Conjuctiva - clear Sclera- Clear EOMI                Ears - EAC's Wnl TM's Wnl Gross Hearing WNL                Throat - oropharanx wnl Respiratory - Lungs with rhonchi bilateral Cardiac - RRR S1 and S2 without murmur GI - Abdomen soft Nontender and bowel sounds active x 4 Extremities - No edema. Neuro - Grossly intact.   Xray - Possible left lung base infiltrate    Assessment & Plan:  SOB (shortness of breath) - Plan: DG Chest 2 View, levofloxacin (LEVAQUIN) 500 MG tablet, methylPREDNIsolone (MEDROL DOSPACK) 4 MG tablet, methylPREDNISolone acetate (DEPO-MEDROL) injection 80 mg, cefTRIAXone (ROCEPHIN) injection 1 g, levalbuterol (XOPENEX) nebulizer solution 1.25 mg  Cough - Plan: DG Chest 2 View, levofloxacin (LEVAQUIN) 500 MG tablet, methylPREDNIsolone (MEDROL DOSPACK) 4 MG tablet, methylPREDNISolone acetate (DEPO-MEDROL) injection 80 mg, cefTRIAXone (ROCEPHIN) injection 1 g, HYDROcodone-acetaminophen (NORCO) 5-325 MG per tablet  Pneumonia - Plan: levofloxacin (LEVAQUIN) 500 MG tablet, methylPREDNIsolone (MEDROL DOSPACK) 4 MG tablet, methylPREDNISolone acetate (DEPO-MEDROL) injection 80 mg, cefTRIAXone (ROCEPHIN) injection 1 g  Chest pain - Plan:  HYDROcodone-acetaminophen (NORCO) 5-325 MG per tablet  Push po fluids, rest, tylenol and motrin otc prn as directed for fever, arthralgias, and myalgias.  Follow up prn if sx's continue or persist.  Follow up in 2 weeks or prn and if sob not better follow up or go to ED.  Deatra CanterWilliam J Stephone Gum FNP

## 2013-09-15 NOTE — Patient Instructions (Signed)

## 2013-09-15 NOTE — Telephone Encounter (Signed)
Spoke with patient on 12/12

## 2013-09-22 ENCOUNTER — Ambulatory Visit (INDEPENDENT_AMBULATORY_CARE_PROVIDER_SITE_OTHER): Payer: BC Managed Care – PPO | Admitting: Nurse Practitioner

## 2013-09-22 ENCOUNTER — Encounter: Payer: Self-pay | Admitting: Family Medicine

## 2013-09-22 VITALS — BP 143/79 | HR 79 | Temp 98.8°F | Ht 63.0 in | Wt 199.0 lb

## 2013-09-22 DIAGNOSIS — J189 Pneumonia, unspecified organism: Secondary | ICD-10-CM

## 2013-09-22 LAB — POCT CBC
Granulocyte percent: 73.9 %G (ref 37–80)
HEMATOCRIT: 34.5 % — AB (ref 37.7–47.9)
HEMOGLOBIN: 10.8 g/dL — AB (ref 12.2–16.2)
Lymph, poc: 3 (ref 0.6–3.4)
MCH, POC: 23.6 pg — AB (ref 27–31.2)
MCHC: 31.3 g/dL — AB (ref 31.8–35.4)
MCV: 75.3 fL — AB (ref 80–97)
MPV: 6.9 fL (ref 0–99.8)
POC GRANULOCYTE: 10.1 — AB (ref 2–6.9)
POC LYMPH PERCENT: 21.7 %L (ref 10–50)
Platelet Count, POC: 277 10*3/uL (ref 142–424)
RBC: 4.6 M/uL (ref 4.04–5.48)
RDW, POC: 15.9 %
WBC: 13.7 10*3/uL — AB (ref 4.6–10.2)

## 2013-09-22 MED ORDER — AZITHROMYCIN 250 MG PO TABS: ORAL_TABLET | ORAL | Status: DC

## 2013-09-22 MED ORDER — ALBUTEROL SULFATE HFA 108 (90 BASE) MCG/ACT IN AERS: 2.0000 | INHALATION_SPRAY | Freq: Four times a day (QID) | RESPIRATORY_TRACT | Status: DC | PRN

## 2013-09-22 MED ORDER — BUDESONIDE-FORMOTEROL FUMARATE 80-4.5 MCG/ACT IN AERO: 2.0000 | INHALATION_SPRAY | Freq: Two times a day (BID) | RESPIRATORY_TRACT | Status: DC

## 2013-09-22 NOTE — Patient Instructions (Signed)

## 2013-09-22 NOTE — Progress Notes (Signed)
   Subjective:    Patient ID: Caitlin Holmes, female    DOB: 1961/03/19, 53 y.o.   MRN: [id number]  HPI Patient was seen 1 week ago and was diagnosed with pneumonia- was given rocepin IM and depomedrol IM, Was put on levaquin and prednisone at home with xopenex inhaler- SHe has used the entire inhaler (was only a sample dose). She is in today saying that she is still coughing. No fever or chills- does c/o occasional headache.    Review of Systems  Constitutional: Positive for fatigue. Negative for fever and chills.  HENT: Positive for congestion.   Respiratory: Positive for cough and shortness of breath.   Cardiovascular: Negative.   Gastrointestinal: Negative.   Genitourinary: Negative.   Musculoskeletal: Negative.   Neurological: Negative.   Hematological: Negative.        Objective:   Physical Exam  Constitutional: She is oriented to person, place, and time. She appears well-developed and well-nourished.  Neck: Normal range of motion. Neck supple.  Cardiovascular: Normal rate, regular rhythm and normal heart sounds.   Pulmonary/Chest: Effort normal. She has rales (very fine in left lower lobe).  Abdominal: Soft. Bowel sounds are normal.  Lymphadenopathy:    She has no cervical adenopathy.  Neurological: She is alert and oriented to person, place, and time.  Skin: Skin is warm and dry.  Psychiatric: She has a normal mood and affect. Her behavior is normal. Judgment and thought content normal.   BP 143/79  Pulse 79  Temp(Src) 98.8 F (37.1 C) (Oral)  Ht 5\' 3"  (1.6 m)  Wt 199 lb (90.266 kg)  BMI 35.26 kg/m2  SpO2 94%  Results for orders placed in visit on 09/22/13  POCT CBC      Result Value Range   WBC 13.7 (*) 4.6 - 10.2 K/uL   Lymph, poc 3.0  0.6 - 3.4   POC LYMPH PERCENT 21.7  10 - 50 %L   MID (cbc)    0 - 0.9   POC MID %    0 - 12 %M   POC Granulocyte 10.1 (*) 2 - 6.9   Granulocyte percent 73.9  37 - 80 %G   RBC 4.6  4.04 - 5.48 M/uL   Hemoglobin 10.8 (*)  12.2 - 16.2 g/dL   HCT, POC 46.934.5 (*) 62.937.7 - 47.9 %   MCV 75.3 (*) 80 - 97 fL   MCH, POC 23.6 (*) 27 - 31.2 pg   MCHC 31.3 (*) 31.8 - 35.4 g/dL   RDW, POC 52.815.9     Platelet Count, POC 277.0  142 - 424 K/uL   MPV 6.9  0 - 99.8 fL        Assessment & Plan:   1. Pneumonia    symbicort BID as rx albuterl HFA as needed Force fluids Rest RTO in 6 weeks to repeat chest xray z pak as rx  Mary-Margaret Daphine DeutscherMartin, FNP

## 2013-09-23 ENCOUNTER — Telehealth: Payer: Self-pay | Admitting: Nurse Practitioner

## 2013-09-24 MED ORDER — FLUCONAZOLE 150 MG PO TABS: 150.0000 mg | ORAL_TABLET | Freq: Once | ORAL | Status: DC

## 2013-10-12 ENCOUNTER — Ambulatory Visit (INDEPENDENT_AMBULATORY_CARE_PROVIDER_SITE_OTHER): Payer: BC Managed Care – PPO

## 2013-10-12 ENCOUNTER — Ambulatory Visit (INDEPENDENT_AMBULATORY_CARE_PROVIDER_SITE_OTHER): Payer: BC Managed Care – PPO | Admitting: Family Medicine

## 2013-10-12 ENCOUNTER — Encounter: Payer: Self-pay | Admitting: Family Medicine

## 2013-10-12 VITALS — BP 127/78 | HR 82 | Temp 98.4°F | Ht 63.0 in | Wt 204.0 lb

## 2013-10-12 DIAGNOSIS — R05 Cough: Secondary | ICD-10-CM

## 2013-10-12 DIAGNOSIS — J209 Acute bronchitis, unspecified: Secondary | ICD-10-CM

## 2013-10-12 DIAGNOSIS — R059 Cough, unspecified: Secondary | ICD-10-CM

## 2013-10-12 MED ORDER — METHYLPREDNISOLONE ACETATE 80 MG/ML IJ SUSP
80.0000 mg | Freq: Once | INTRAMUSCULAR | Status: AC
  Administered 2013-10-12: 80 mg via INTRAMUSCULAR

## 2013-10-12 MED ORDER — PREDNISONE 10 MG PO TABS: ORAL_TABLET | ORAL | Status: DC

## 2013-10-12 MED ORDER — HYDROCODONE-HOMATROPINE 5-1.5 MG/5ML PO SYRP: 5.0000 mL | ORAL_SOLUTION | Freq: Three times a day (TID) | ORAL | Status: DC | PRN

## 2013-10-12 MED ORDER — LEVALBUTEROL HCL 1.25 MG/0.5ML IN NEBU: 1.2500 mg | INHALATION_SOLUTION | Freq: Once | RESPIRATORY_TRACT | Status: DC

## 2013-10-12 MED ORDER — AZITHROMYCIN 250 MG PO TABS: ORAL_TABLET | ORAL | Status: DC

## 2013-10-12 NOTE — Progress Notes (Signed)
   Subjective:    Patient ID: Caitlin Holmes, female    DOB: 19-Aug-1961, 53 y.o.   MRN: [id number]  HPI  This 53 y.o. female presents for evaluation of cough and shortness of breath.  Review of Systems C/o cough, congestion, and malaise and sob   No chest pain, HA, dizziness, vision change, N/V, diarrhea, constipation, dysuria, urinary urgency or frequency, myalgias, arthralgias or rash.  Objective:   Physical Exam Vital signs noted  Well developed well nourished female.  HEENT - Head atraumatic Normocephalic                Eyes - PERRLA, Conjuctiva - clear Sclera- Clear EOMI                Ears - EAC's Wnl TM's Wnl Gross Hearing WNL                Nose - Nares patent                 Throat - oropharanx wnl Respiratory - Lungs CTA bilateral Cardiac - RRR S1 and S2 without murmur GI - Abdomen soft Nontender and bowel sounds active x 4 Extremities - No edema. Neuro - Grossly intact.  Post neb - Lungs CTA  CXR - No infiltrates     Assessment & Plan:  Cough - Plan: DG Chest 2 View, methylPREDNISolone acetate (DEPO-MEDROL) injection 80 mg, levalbuterol (XOPENEX) nebulizer solution 1.25 mg  Acute bronchitis - Plan: methylPREDNISolone acetate (DEPO-MEDROL) injection 80 mg, levalbuterol (XOPENEX) nebulizer solution 1.25 mg  Push po fluids, rest, tylenol and motrin otc prn as directed for fever, arthralgias, and myalgias.  Follow up prn if sx's continue or persist.   Deatra CanterWilliam J Oxford FNP

## 2013-10-12 NOTE — Patient Instructions (Signed)

## 2013-10-13 ENCOUNTER — Telehealth: Payer: Self-pay | Admitting: *Deleted

## 2013-10-13 NOTE — Telephone Encounter (Signed)
Developed diffuse pruritis after taking cough medicine with codeine. She has been taking Benadryl but it is providing relief. Advised to discontinue the cough medicine. Patient is concerned that she won't be able to sleep without it but I explained that an allergic reaction can increase in severity and she does not need to take any more codeine.  Patient stated understanding and agreement to plan.  Can we prescribe a different cough medicine or should she use OTC?

## 2013-10-14 NOTE — Telephone Encounter (Signed)
Delsyn cough syrup otc works very well

## 2013-10-14 NOTE — Telephone Encounter (Signed)
Patient notified to try some OTC Delsym. Patient verbalized understanding

## 2013-10-28 ENCOUNTER — Encounter (HOSPITAL_COMMUNITY): Payer: Self-pay | Admitting: Emergency Medicine

## 2013-10-28 ENCOUNTER — Emergency Department (HOSPITAL_COMMUNITY)
Admission: EM | Admit: 2013-10-28 | Discharge: 2013-10-28 | Disposition: A | Discharge status: Home / Self Care | Payer: BC Managed Care – PPO | Attending: Emergency Medicine | Admitting: Emergency Medicine

## 2013-10-28 DIAGNOSIS — Z8709 Personal history of other diseases of the respiratory system: Secondary | ICD-10-CM | POA: Insufficient documentation

## 2013-10-28 DIAGNOSIS — F3289 Other specified depressive episodes: Secondary | ICD-10-CM | POA: Insufficient documentation

## 2013-10-28 DIAGNOSIS — Z880 Allergy status to penicillin: Secondary | ICD-10-CM | POA: Insufficient documentation

## 2013-10-28 DIAGNOSIS — G43909 Migraine, unspecified, not intractable, without status migrainosus: Secondary | ICD-10-CM | POA: Insufficient documentation

## 2013-10-28 DIAGNOSIS — IMO0002 Reserved for concepts with insufficient information to code with codable children: Secondary | ICD-10-CM | POA: Insufficient documentation

## 2013-10-28 DIAGNOSIS — Z862 Personal history of diseases of the blood and blood-forming organs and certain disorders involving the immune mechanism: Secondary | ICD-10-CM | POA: Insufficient documentation

## 2013-10-28 DIAGNOSIS — M129 Arthropathy, unspecified: Secondary | ICD-10-CM | POA: Insufficient documentation

## 2013-10-28 DIAGNOSIS — F329 Major depressive disorder, single episode, unspecified: Secondary | ICD-10-CM | POA: Insufficient documentation

## 2013-10-28 DIAGNOSIS — F411 Generalized anxiety disorder: Secondary | ICD-10-CM | POA: Insufficient documentation

## 2013-10-28 DIAGNOSIS — Z79899 Other long term (current) drug therapy: Secondary | ICD-10-CM | POA: Insufficient documentation

## 2013-10-28 DIAGNOSIS — I10 Essential (primary) hypertension: Secondary | ICD-10-CM | POA: Insufficient documentation

## 2013-10-28 DIAGNOSIS — G2581 Restless legs syndrome: Secondary | ICD-10-CM | POA: Insufficient documentation

## 2013-10-28 DIAGNOSIS — Z87448 Personal history of other diseases of urinary system: Secondary | ICD-10-CM | POA: Insufficient documentation

## 2013-10-28 MED ORDER — ONDANSETRON HCL 4 MG/2ML IJ SOLN
4.0000 mg | Freq: Once | INTRAMUSCULAR | Status: AC
  Administered 2013-10-28: 4 mg via INTRAVENOUS
  Filled 2013-10-28: qty 2

## 2013-10-28 MED ORDER — MORPHINE SULFATE 4 MG/ML IJ SOLN
6.0000 mg | Freq: Once | INTRAMUSCULAR | Status: AC
  Administered 2013-10-28: 6 mg via INTRAVENOUS
  Filled 2013-10-28: qty 2

## 2013-10-28 MED ORDER — PROCHLORPERAZINE EDISYLATE 5 MG/ML IJ SOLN
10.0000 mg | Freq: Once | INTRAMUSCULAR | Status: AC
  Administered 2013-10-28: 10 mg via INTRAVENOUS
  Filled 2013-10-28: qty 2

## 2013-10-28 MED ORDER — SODIUM CHLORIDE 0.9 % IV BOLUS (SEPSIS)
1000.0000 mL | Freq: Once | INTRAVENOUS | Status: AC
  Administered 2013-10-28: 1000 mL via INTRAVENOUS

## 2013-10-28 NOTE — Discharge Instructions (Signed)
Migraine Headache A migraine headache is an intense, throbbing pain on one or both sides of your head. A migraine can last for 30 minutes to several hours. CAUSES  The exact cause of a migraine headache is not always known. However, a migraine may be caused when nerves in the brain become irritated and release chemicals that cause inflammation. This causes pain. Certain things may also trigger migraines, such as:  Alcohol.  Smoking.  Stress.  Menstruation.  Aged cheeses.  Foods or drinks that contain nitrates, glutamate, aspartame, or tyramine.  Lack of sleep.  Chocolate.  Caffeine.  Hunger.  Physical exertion.  Fatigue.  Medicines used to treat chest pain (nitroglycerine), birth control pills, estrogen, and some blood pressure medicines. SIGNS AND SYMPTOMS  Pain on one or both sides of your head.  Pulsating or throbbing pain.  Severe pain that prevents daily activities.  Pain that is aggravated by any physical activity.  Nausea, vomiting, or both.  Dizziness.  Pain with exposure to bright lights, loud noises, or activity.  General sensitivity to bright lights, loud noises, or smells. Before you get a migraine, you may get warning signs that a migraine is coming (aura). An aura may include:  Seeing flashing lights.  Seeing bright spots, halos, or zig-zag lines.  Having tunnel vision or blurred vision.  Having feelings of numbness or tingling.  Having trouble talking.  Having muscle weakness. DIAGNOSIS  A migraine headache is often diagnosed based on:  Symptoms.  Physical exam.  A CT scan or MRI of your head. These imaging tests cannot diagnose migraines, but they can help rule out other causes of headaches. TREATMENT Medicines may be given for pain and nausea. Medicines can also be given to help prevent recurrent migraines.  HOME CARE INSTRUCTIONS  Only take over-the-counter or prescription medicines for pain or discomfort as directed by your  health care provider. The use of long-term narcotics is not recommended.  Lie down in a dark, quiet room when you have a migraine.  Keep a journal to find out what may trigger your migraine headaches. For example, write down:  What you eat and drink.  How much sleep you get.  Any change to your diet or medicines.  Limit alcohol consumption.  Quit smoking if you smoke.  Get 7 9 hours of sleep, or as recommended by your health care provider.  Limit stress.  Keep lights dim if bright lights bother you and make your migraines worse. SEEK IMMEDIATE MEDICAL CARE IF:   Your migraine becomes severe.  You have a fever.  You have a stiff neck.  You have vision loss.  You have muscular weakness or loss of muscle control.  You start losing your balance or have trouble walking.  You feel faint or pass out.  You have severe symptoms that are different from your first symptoms. MAKE SURE YOU:   Understand these instructions.  Will watch your condition.  Will get help right away if you are not doing well or get worse. Document Released: 08/27/2005 Document Revised: 06/17/2013 Document Reviewed: 05/04/2013 [employer] Patient Information 2014 [employer].  

## 2013-10-28 NOTE — ED Notes (Signed)
Pt. Noted wanted to use the restroom. Pt. Noted found trying to leave out of the ambulance bay.  Pt. States that "I am ready to leave now."  Pt. Is noted talking on the phone to her ride that is not here yet.

## 2013-10-28 NOTE — ED Notes (Signed)
Pt. Ambulated to the bathroom

## 2013-10-28 NOTE — ED Notes (Signed)
Gave pt. Ice packs for her knees
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
## 2013-10-31 NOTE — ED Provider Notes (Signed)
CSN: [id number]     Arrival date & time 10/28/13  0808 History   First MD Initiated Contact with Patient 10/28/13 0813     Chief Complaint  Patient presents with  . Migraine      HPI Patient has a history of migraine headache.  She states her headache at the present for the last week.  Her headache worsened today and felt as though she has a small amount of blurred vision.  She states this feels like her prior migraine headaches.  No recent injury or trauma.  No fevers or chills.  No neck pain or stiffness.  Nausea without vomiting.   Past Medical History  Diagnosis Date  . Hypertension   . Allergic rhinitis   . Chronic headache   . OSA (obstructive sleep apnea)   . Anemia   . Arthritis   . Depression   . Restless leg syndrome   . Anxiety   . Urinary urgency   . Sinus tachycardia   . Hemorrhoids    Past Surgical History  Procedure Laterality Date  . Nasal septum surgery  1999  . Uvulopalatopharyngoplasty, tonsillectomy and septoplasty  1999    Dr. Clearance Coots at [facility]  . Rotator cuff repair      Right  . Meniscus repair      Right   Family History  Problem Relation Age of Onset  . Emphysema Mother   . Allergies Mother   . Asthma Mother   . Emphysema Maternal Grandmother   . Colon cancer Maternal Grandmother   . Cancer Maternal Grandmother     colon  . Stroke Maternal Grandmother   . Allergies Father   . Prostate cancer Father   . Allergies Brother   . Allergies Sister   . Hypertension Sister   . Diabetes Paternal Grandmother    History  Substance Use Topics  . Smoking status: Never Smoker   . Smokeless tobacco: Never Used  . Alcohol Use: No   OB History   Grav Para Term Preterm Abortions TAB SAB Ect Mult Living                 Review of Systems  All other systems reviewed and are negative.      Allergies  Advair diskus; Amoxicillin; Naproxen; Penicillins; Sertraline hcl; and Zoloft  Home Medications   Current Outpatient Rx  Name  Route   Sig  Dispense  Refill  . albuterol (PROVENTIL HFA;VENTOLIN HFA) 108 (90 BASE) MCG/ACT inhaler   Inhalation   Inhale 2 puffs into the lungs every 6 (six) hours as needed for wheezing or shortness of breath.   1 Inhaler   0   . budesonide-formoterol (SYMBICORT) 80-4.5 MCG/ACT inhaler   Inhalation   Inhale 2 puffs into the lungs 2 (two) times daily.   1 Inhaler   3   . buPROPion (WELLBUTRIN XL) 300 MG 24 hr tablet   Oral   Take 300 mg by mouth daily.          . calcium citrate-vitamin D (CITRACAL+D) 315-200 MG-UNIT per tablet   Oral   Take 1 tablet by mouth daily.          . clonazePAM (KLONOPIN) 1 MG tablet   Oral   Take 1 mg by mouth 3 (three) times daily as needed for anxiety.          . cycloSPORINE (RESTASIS) 0.05 % ophthalmic emulsion   Both Eyes   Place 2 drops into both eyes  2 (two) times daily as needed.          Marland Kitchen. escitalopram (LEXAPRO) 20 MG tablet   Oral   Take 20 mg by mouth daily.          . fluticasone (FLONASE) 50 MCG/ACT nasal spray   Each Nare   Place 1 spray into both nostrils daily.          Marland Kitchen. gabapentin (NEURONTIN) 300 MG capsule   Oral   Take 1 capsule (300 mg total) by mouth 2 (two) times daily.   60 capsule   1   . ibuprofen (ADVIL,MOTRIN) 800 MG tablet   Oral   Take 800 mg by mouth every 8 (eight) hours as needed for moderate pain or cramping.          . metoprolol succinate (TOPROL-XL) 50 MG 24 hr tablet   Oral   Take 50 mg by mouth daily. Take with or immediately following a meal.         . oxybutynin (DITROPAN) 5 MG tablet   Oral   Take 1 tablet (5 mg total) by mouth 2 (two) times daily.   60 tablet   5   . rOPINIRole (REQUIP XL) 4 MG 24 hr tablet   Oral   Take 1 tablet (4 mg total) by mouth at bedtime.   30 tablet   3   . traMADol (ULTRAM) 50 MG tablet   Oral   Take 1 tablet (50 mg total) by mouth every 8 (eight) hours as needed for pain.   30 tablet   0   . tretinoin (RETIN-A) 0.05 % cream   Topical    Apply topically at bedtime.   45 g   0    BP 121/83  Pulse 98  Temp(Src) 98.6 F (37 C) (Oral)  Resp 17  SpO2 97%  LMP 10/11/2013 Physical Exam  Nursing note and vitals reviewed. Constitutional: She is oriented to person, place, and time. She appears well-developed and well-nourished. No distress.  HENT:  Head: Normocephalic and atraumatic.  Eyes: EOM are normal. Pupils are equal, round, and reactive to light.  Neck: Normal range of motion.  Cardiovascular: Normal rate, regular rhythm and normal heart sounds.   Pulmonary/Chest: Effort normal and breath sounds normal.  Abdominal: Soft. She exhibits no distension. There is no tenderness.  Musculoskeletal: Normal range of motion.  Neurological: She is alert and oriented to person, place, and time.  5/5 strength in major muscle groups of  bilateral upper and lower extremities. Speech normal. No facial asymetry.   Skin: Skin is warm and dry.  Psychiatric: She has a normal mood and affect. Judgment normal.    ED Course  Procedures (including critical care time) Labs Review Labs Reviewed - No data to display Imaging Review No results found.  EKG Interpretation   None       MDM   Final diagnoses:  Migraine    Patient feels better after treatment in the emergency department.  Doubt subarachnoid hemorrhage.  Discharge home in good condition.    Lyanne CoKevin M Cordai Rodrigue, MD 10/31/13 [phone]

## 2013-11-03 ENCOUNTER — Other Ambulatory Visit: Payer: BC Managed Care – PPO

## 2013-11-04 ENCOUNTER — Ambulatory Visit: Payer: BC Managed Care – PPO | Admitting: Family Medicine

## 2013-11-10 ENCOUNTER — Other Ambulatory Visit: Payer: Self-pay | Admitting: Nurse Practitioner

## 2013-11-11 ENCOUNTER — Ambulatory Visit: Payer: BC Managed Care – PPO | Admitting: Family Medicine

## 2013-11-26 ENCOUNTER — Telehealth: Payer: Self-pay | Admitting: Family Medicine

## 2013-11-26 ENCOUNTER — Ambulatory Visit: Payer: BC Managed Care – PPO | Admitting: Family Medicine

## 2013-11-26 NOTE — Telephone Encounter (Signed)
appt given for 3/26 with bill

## 2013-12-03 ENCOUNTER — Ambulatory Visit: Payer: BC Managed Care – PPO | Admitting: Family Medicine

## 2013-12-07 ENCOUNTER — Encounter: Payer: Self-pay | Admitting: Family Medicine

## 2013-12-07 ENCOUNTER — Ambulatory Visit (INDEPENDENT_AMBULATORY_CARE_PROVIDER_SITE_OTHER): Payer: BC Managed Care – PPO | Admitting: Family Medicine

## 2013-12-07 VITALS — BP 164/85 | HR 96 | Temp 98.8°F | Ht 63.0 in | Wt 209.4 lb

## 2013-12-07 DIAGNOSIS — M549 Dorsalgia, unspecified: Secondary | ICD-10-CM

## 2013-12-07 MED ORDER — IBUPROFEN 800 MG PO TABS: 800.0000 mg | ORAL_TABLET | Freq: Three times a day (TID) | ORAL | Status: AC | PRN

## 2013-12-07 NOTE — Progress Notes (Signed)
   Subjective:    Patient ID: Caitlin Holmes, female    DOB: [birth year], 53 y.o.   MRN: [id number]  HPI C/o back pain and she has gone to chiropractor and it has not helped.  She has hx of Arthritis.  She has not had any injuries.  She is being rx'd ultram by Dr. Darleen CrockerKofi and this is  Helping.  She had fall on cement floor last year and she went to the chiropractor and it Has not helped.    Review of Systems C/o back pain   No chest pain, SOB, HA, dizziness, vision change, N/V, diarrhea, constipation, dysuria, urinary urgency or frequency or rash.  Objective:   Physical Exam  Vital signs noted  Well developed well nourished female.  HEENT - Head atraumatic Normocephalic                Eyes - PERRLA, Conjuctiva - clear Sclera- Clear EOMI                Ears - EAC's Wnl TM's Wnl Gross Hearing WNL                Throat - oropharanx wnl Respiratory - Lungs CTA bilateral Cardiac - RRR S1 and S2 without murmur GI - Abdomen soft Nontender and bowel sounds active x 4 MS - TTP bilateral LS muscles     Assessment & Plan:  Back pain - Plan: Ambulatory referral to Physical Therapy, ibuprofen (ADVIL,MOTRIN) 800 MG tablet Continue the ulram rx'd by Dr. Darleen CrockerKofi neurology.  Follow up prn  Deatra CanterWilliam J Vinay Ertl FNP

## 2013-12-14 ENCOUNTER — Ambulatory Visit: Payer: BC Managed Care – PPO | Attending: Family Medicine

## 2013-12-14 DIAGNOSIS — M545 Low back pain, unspecified: Secondary | ICD-10-CM | POA: Diagnosis not present

## 2013-12-14 DIAGNOSIS — R293 Abnormal posture: Secondary | ICD-10-CM | POA: Insufficient documentation

## 2013-12-14 DIAGNOSIS — IMO0001 Reserved for inherently not codable concepts without codable children: Secondary | ICD-10-CM | POA: Diagnosis present

## 2013-12-14 DIAGNOSIS — M6281 Muscle weakness (generalized): Secondary | ICD-10-CM | POA: Diagnosis not present

## 2013-12-14 DIAGNOSIS — Z9181 History of falling: Secondary | ICD-10-CM | POA: Diagnosis not present

## 2013-12-14 DIAGNOSIS — I10 Essential (primary) hypertension: Secondary | ICD-10-CM | POA: Diagnosis not present

## 2013-12-21 ENCOUNTER — Ambulatory Visit: Payer: BC Managed Care – PPO | Admitting: Physical Therapy

## 2013-12-21 DIAGNOSIS — IMO0001 Reserved for inherently not codable concepts without codable children: Secondary | ICD-10-CM | POA: Diagnosis not present

## 2013-12-24 ENCOUNTER — Telehealth: Payer: Self-pay | Admitting: Family Medicine

## 2013-12-24 ENCOUNTER — Ambulatory Visit: Payer: BC Managed Care – PPO | Admitting: Physical Therapy

## 2013-12-24 DIAGNOSIS — IMO0001 Reserved for inherently not codable concepts without codable children: Secondary | ICD-10-CM | POA: Diagnosis not present

## 2013-12-24 MED ORDER — TRAMADOL HCL 50 MG PO TABS: 50.0000 mg | ORAL_TABLET | Freq: Three times a day (TID) | ORAL | Status: DC | PRN

## 2013-12-24 NOTE — Telephone Encounter (Signed)
Pt requesting refill on Tramadol Ok per BO

## 2013-12-24 NOTE — Telephone Encounter (Signed)
Made Caitlin Holmes R aware of this at 2pm

## 2013-12-29 ENCOUNTER — Ambulatory Visit: Payer: BC Managed Care – PPO | Admitting: Physical Therapy

## 2013-12-29 DIAGNOSIS — IMO0001 Reserved for inherently not codable concepts without codable children: Secondary | ICD-10-CM | POA: Diagnosis not present

## 2013-12-31 ENCOUNTER — Encounter: Payer: BC Managed Care – PPO | Admitting: Physical Therapy

## 2014-01-05 ENCOUNTER — Ambulatory Visit: Payer: BC Managed Care – PPO | Admitting: Physical Therapy

## 2014-01-05 DIAGNOSIS — IMO0001 Reserved for inherently not codable concepts without codable children: Secondary | ICD-10-CM | POA: Diagnosis not present

## 2014-01-06 ENCOUNTER — Encounter: Payer: Self-pay | Admitting: Nurse Practitioner

## 2014-01-06 ENCOUNTER — Ambulatory Visit (INDEPENDENT_AMBULATORY_CARE_PROVIDER_SITE_OTHER): Payer: BC Managed Care – PPO | Admitting: Nurse Practitioner

## 2014-01-06 ENCOUNTER — Ambulatory Visit (INDEPENDENT_AMBULATORY_CARE_PROVIDER_SITE_OTHER): Payer: BC Managed Care – PPO

## 2014-01-06 VITALS — BP 197/107 | HR 119 | Temp 97.2°F | Ht 63.0 in | Wt 213.0 lb

## 2014-01-06 DIAGNOSIS — M25561 Pain in right knee: Secondary | ICD-10-CM

## 2014-01-06 DIAGNOSIS — M25569 Pain in unspecified knee: Secondary | ICD-10-CM

## 2014-01-06 DIAGNOSIS — M25562 Pain in left knee: Principal | ICD-10-CM

## 2014-01-06 MED ORDER — METHYLPREDNISOLONE ACETATE 80 MG/ML IJ SUSP
80.0000 mg | Freq: Once | INTRAMUSCULAR | Status: AC
  Administered 2014-01-06: 80 mg via INTRAMUSCULAR

## 2014-01-06 NOTE — Patient Instructions (Signed)
Knee Pain Knee pain can be a result of an injury or other medical conditions. Treatment will depend on the cause of your pain. HOME CARE  Only take medicine as told by your doctor.  Keep a healthy weight. Being overweight can make the knee hurt more.  Stretch before exercising or playing sports.  If there is constant knee pain, change the way you exercise. Ask your doctor for advice.  Make sure shoes fit well. Choose the right shoe for the sport or activity.  Protect your knees. Wear kneepads if needed.  Rest when you are tired. GET HELP RIGHT AWAY IF:   Your knee pain does not stop.  Your knee pain does not get better.  Your knee joint feels hot to the touch.  You have a fever. MAKE SURE YOU:   Understand these instructions.  Will watch this condition.  Will get help right away if you are not doing well or get worse. Document Released: 11/23/2008 Document Revised: 11/19/2011 Document Reviewed: 11/23/2008 Exit[employer] Patient Information 2014 [employer].  

## 2014-01-06 NOTE — Progress Notes (Signed)
   Subjective:    Patient ID: West BaliJoyce A Booton, female    DOB: July 24, 1961, 53 y.o.   MRN: [id number]  HPI Patient in c/o bil leg pain- has gotten worse the last several weeks- SHe has tried all kinds of remedies including epsom salt soaks- Nothing is helping. Says that knees hurt her so bad that it keeps her up at night. Rates pain a 10/10 all the time.    Review of Systems  Constitutional: Negative.   HENT: Negative.   Respiratory: Negative.   Cardiovascular: Negative.   Gastrointestinal: Negative.   Musculoskeletal: Positive for joint swelling.  Skin: Negative.   Psychiatric/Behavioral: Negative.   All other systems reviewed and are negative.      Objective:   Physical Exam  Constitutional: She appears well-developed and well-nourished.  Cardiovascular: Normal rate, regular rhythm and normal heart sounds.   Pulmonary/Chest: Effort normal and breath sounds normal.  Musculoskeletal: Normal range of motion.  No knee effusion bil No patella tenderness bil FROM without crepitus All ligaments intact bil   Left knee- Normal-Preliminary reading by Paulene FloorMary Mayfield Schoene, FNP  [facility] Right knee- normal- Preliminary reading by Paulene FloorMary Lewis Keats, FNP  [facility]  BP 197/107  Pulse 119  Temp(Src) 97.2 F (36.2 C) (Oral)  Ht 5\' 3"  (1.6 m)  Wt 213 lb (96.616 kg)  BMI 37.74 kg/m2  LMP 11/29/2013        Assessment & Plan:   1. Bilateral knee pain    Meds ordered this encounter  Medications  . methylPREDNISolone acetate (DEPO-MEDROL) injection 80 mg    Sig:    Orders Placed This Encounter  Procedures  . DG Knee 1-2 Views Left    Standing Status: Future     Number of Occurrences: 1     Standing Expiration Date: 03/08/2015    Order Specific Question:  Reason for Exam (SYMPTOM  OR DIAGNOSIS REQUIRED)    Answer:  knee pain    Order Specific Question:  Is the patient pregnant?    Answer:  No    Order Specific Question:  Preferred imaging location?    Answer:  Internal  . DG Knee 1-2 Views Right      Standing Status: Future     Number of Occurrences: 1     Standing Expiration Date: 03/08/2015    Order Specific Question:  Reason for Exam (SYMPTOM  OR DIAGNOSIS REQUIRED)    Answer:  knee pain    Order Specific Question:  Is the patient pregnant?    Answer:  No    Order Specific Question:  Preferred imaging location?    Answer:  Internal  . Arthritis Panel  . Ambulatory referral to Orthopedic Surgery    Referral Priority:  Routine    Referral Type:  Surgical    Referral Reason:  Specialty Services Required    Requested Specialty:  Orthopedic Surgery    Number of Visits Requested:  1    Ice knees Continue ultram as rx Follow up prn  Mary-Margaret Daphine DeutscherMartin, FNP

## 2014-01-07 LAB — ARTHRITIS PANEL
BASOS: 0 %
Basophils Absolute: 0 10*3/uL (ref 0.0–0.2)
EOS: 3 %
Eosinophils Absolute: 0.2 10*3/uL (ref 0.0–0.4)
HCT: 29.3 % — ABNORMAL LOW (ref 34.0–46.6)
HEMOGLOBIN: 9.3 g/dL — AB (ref 11.1–15.9)
IMMATURE GRANULOCYTES: 0 %
Immature Grans (Abs): 0 10*3/uL (ref 0.0–0.1)
LYMPHS ABS: 1.2 10*3/uL (ref 0.7–3.1)
Lymphs: 20 %
MCH: 24.2 pg — ABNORMAL LOW (ref 26.6–33.0)
MCHC: 31.7 g/dL (ref 31.5–35.7)
MCV: 76 fL — AB (ref 79–97)
MONOCYTES: 5 %
Monocytes Absolute: 0.3 10*3/uL (ref 0.1–0.9)
Neutrophils Absolute: 4.1 10*3/uL (ref 1.4–7.0)
Neutrophils Relative %: 72 %
PLATELETS: 300 10*3/uL (ref 150–379)
RBC: 3.84 x10E6/uL (ref 3.77–5.28)
RDW: 16.7 % — ABNORMAL HIGH (ref 12.3–15.4)
Rhuematoid fact SerPl-aCnc: 8 IU/mL (ref 0.0–13.9)
Sed Rate: 8 mm/hr (ref 0–40)
Uric Acid: 4.5 mg/dL (ref 2.5–7.1)
WBC: 5.8 10*3/uL (ref 3.4–10.8)

## 2014-01-13 ENCOUNTER — Telehealth: Payer: Self-pay | Admitting: Nurse Practitioner

## 2014-01-14 ENCOUNTER — Ambulatory Visit: Payer: BC Managed Care – PPO | Attending: Family Medicine | Admitting: Physical Therapy

## 2014-01-14 DIAGNOSIS — Z9181 History of falling: Secondary | ICD-10-CM | POA: Diagnosis not present

## 2014-01-14 DIAGNOSIS — I10 Essential (primary) hypertension: Secondary | ICD-10-CM | POA: Insufficient documentation

## 2014-01-14 DIAGNOSIS — R293 Abnormal posture: Secondary | ICD-10-CM | POA: Insufficient documentation

## 2014-01-14 DIAGNOSIS — IMO0001 Reserved for inherently not codable concepts without codable children: Secondary | ICD-10-CM | POA: Diagnosis present

## 2014-01-14 DIAGNOSIS — M6281 Muscle weakness (generalized): Secondary | ICD-10-CM | POA: Insufficient documentation

## 2014-01-14 DIAGNOSIS — M545 Low back pain, unspecified: Secondary | ICD-10-CM | POA: Insufficient documentation

## 2014-01-14 NOTE — Telephone Encounter (Signed)
Left detailed message.   

## 2014-01-14 NOTE — Telephone Encounter (Signed)
Just let therapy know that knees are hurting

## 2014-01-19 ENCOUNTER — Ambulatory Visit: Payer: BC Managed Care – PPO | Admitting: Physical Therapy

## 2014-01-19 DIAGNOSIS — IMO0001 Reserved for inherently not codable concepts without codable children: Secondary | ICD-10-CM | POA: Diagnosis not present

## 2014-01-25 ENCOUNTER — Ambulatory Visit (INDEPENDENT_AMBULATORY_CARE_PROVIDER_SITE_OTHER): Payer: BC Managed Care – PPO | Admitting: Family

## 2014-01-25 ENCOUNTER — Encounter: Payer: Self-pay | Admitting: Family

## 2014-01-25 VITALS — BP 143/78 | HR 89 | Temp 99.1°F | Ht 63.0 in | Wt 203.8 lb

## 2014-01-25 DIAGNOSIS — J329 Chronic sinusitis, unspecified: Secondary | ICD-10-CM

## 2014-01-25 MED ORDER — CLINDAMYCIN HCL 300 MG PO CAPS: 300.0000 mg | ORAL_CAPSULE | Freq: Three times a day (TID) | ORAL | Status: DC

## 2014-01-25 MED ORDER — HYDROCODONE-HOMATROPINE 5-1.5 MG/5ML PO SYRP: 5.0000 mL | ORAL_SOLUTION | Freq: Three times a day (TID) | ORAL | Status: DC | PRN

## 2014-01-25 MED ORDER — CEFIXIME 400 MG PO CAPS: 400.0000 mg | ORAL_CAPSULE | Freq: Every day | ORAL | Status: DC

## 2014-01-25 NOTE — Patient Instructions (Signed)

## 2014-01-25 NOTE — Progress Notes (Signed)
Subjective:    Patient ID: West BaliJoyce A Tomlinson, female    DOB: [birth year], 53 y.o.   MRN: [id number]  Sinusitis This is a new problem. The current episode started in the past 7 days (4-5 days). The problem has been gradually worsening since onset. The maximum temperature recorded prior to her arrival was 100 - 100.9 F. Her pain is at a severity of 7/10. The pain is moderate. Associated symptoms include chills, coughing, headaches, a hoarse voice, shortness of breath, sinus pressure and a sore throat. Pertinent negatives include no ear pain or sneezing. Past treatments include oral decongestants. The treatment provided mild relief.      Review of Systems  Constitutional: Positive for chills.  HENT: Positive for hoarse voice, sinus pressure and sore throat. Negative for ear pain and sneezing.   Respiratory: Positive for cough and shortness of breath.   Neurological: Positive for headaches.  All other systems reviewed and are negative.      Objective:   Physical Exam  Vitals reviewed. Constitutional: She is oriented to person, place, and time. She appears well-developed and well-nourished. No distress.  HENT:  Head: Normocephalic and atraumatic.  Right Ear: External ear normal.  Left Ear: External ear normal.  Mouth/Throat: Oropharynx is clear and moist.  Nasal passage erythemas, right side mild swelling with erythemas   Eyes: Pupils are equal, round, and reactive to light.  Neck: Normal range of motion. Neck supple. No thyromegaly present.  Cardiovascular: Normal rate, regular rhythm, normal heart sounds and intact distal pulses.   No murmur heard. Pulmonary/Chest: Effort normal and breath sounds normal. No respiratory distress. She has no wheezes.  Nonproductive cough   Abdominal: Soft. Bowel sounds are normal. She exhibits no distension. There is no tenderness.  Musculoskeletal: Normal range of motion. She exhibits no edema and no tenderness.  Neurological: She is alert and  oriented to person, place, and time.  Skin: Skin is warm and dry.  Psychiatric: She has a normal mood and affect. Her behavior is normal. Judgment and thought content normal.         BP 143/78  Pulse 89  Temp(Src) 99.1 F (37.3 C) (Oral)  Ht 5\' 3"  (1.6 m)  Wt 203 lb 12.8 oz (92.443 kg)  BMI 36.11 kg/m2  Assessment & Plan:  1. Sinusitis - Take meds as prescribed - Use a cool mist humidifier  -Use saline nose sprays frequently -Saline irrigations of the nose can be very helpful if done frequently.  * 4X daily for 1 week*  * Use of a nettie pot can be helpful with this. Follow directions with this* -Force fluids -For any cough or congestion  Use plain Mucinex- regular strength or max strength is fine   * Children- consult with Pharmacist for dosing -For fever or aces or pains- take tylenol or ibuprofen appropriate for age and weight.  * for fevers greater than 101 orally you may alternate ibuprofen and tylenol every  3 hours. -Throat lozenges if help - Meds ordered this encounter  Medications  . clindamycin (CLEOCIN) 300 MG capsule    Sig: Take 1 capsule (300 mg total) by mouth 3 (three) times daily.    Dispense:  21 capsule    Refill:  0    Order Specific Question:  Supervising Provider    Answer:  Ernestina PennaMOORE, DONALD W [[id number]]  . Cefixime (SUPRAX) 400 MG CAPS capsule    Sig: Take 1 capsule (400 mg total) by mouth daily.    Dispense:  7 capsule    Refill:  0    Order Specific Question:  Supervising Provider    Answer:  Ernestina PennaMOORE, DONALD W [[id number]]  . HYDROcodone-homatropine (HYCODAN) 5-1.5 MG/5ML syrup    Sig: Take 5 mLs by mouth every 8 (eight) hours as needed for cough.    Dispense:  120 mL    Refill:  0    Order Specific Question:  Supervising Provider    Answer:  Deborra MedinaMOORE, DONALD W [[id number]]   -Talked about low risk of cross allergy with the cefixime-Advised pt if itching occurs stop medication -Discussed cough med may cause drowsiness and take only at night if it does cause  drowniness   Jannifer Rodneyhristy Merlyn Bollen, FNP

## 2014-01-28 ENCOUNTER — Encounter: Payer: BC Managed Care – PPO | Admitting: Physical Therapy

## 2014-02-02 ENCOUNTER — Encounter: Payer: BC Managed Care – PPO | Admitting: Physical Therapy

## 2014-02-04 ENCOUNTER — Ambulatory Visit: Payer: BC Managed Care – PPO | Admitting: Physical Therapy

## 2014-02-04 DIAGNOSIS — IMO0001 Reserved for inherently not codable concepts without codable children: Secondary | ICD-10-CM | POA: Diagnosis not present

## 2014-02-08 ENCOUNTER — Other Ambulatory Visit: Payer: Self-pay | Admitting: Nurse Practitioner

## 2014-02-08 ENCOUNTER — Ambulatory Visit: Payer: BC Managed Care – PPO | Admitting: Nurse Practitioner

## 2014-02-09 ENCOUNTER — Ambulatory Visit: Payer: BC Managed Care – PPO | Attending: Family Medicine | Admitting: Physical Therapy

## 2014-02-09 DIAGNOSIS — IMO0001 Reserved for inherently not codable concepts without codable children: Secondary | ICD-10-CM | POA: Insufficient documentation

## 2014-02-09 DIAGNOSIS — M545 Low back pain, unspecified: Secondary | ICD-10-CM | POA: Diagnosis not present

## 2014-02-09 DIAGNOSIS — M6281 Muscle weakness (generalized): Secondary | ICD-10-CM | POA: Diagnosis not present

## 2014-02-09 DIAGNOSIS — I10 Essential (primary) hypertension: Secondary | ICD-10-CM | POA: Diagnosis not present

## 2014-02-09 DIAGNOSIS — Z9181 History of falling: Secondary | ICD-10-CM | POA: Insufficient documentation

## 2014-02-09 DIAGNOSIS — R293 Abnormal posture: Secondary | ICD-10-CM | POA: Insufficient documentation

## 2014-02-11 ENCOUNTER — Ambulatory Visit (INDEPENDENT_AMBULATORY_CARE_PROVIDER_SITE_OTHER): Payer: BC Managed Care – PPO

## 2014-02-11 ENCOUNTER — Encounter: Payer: BC Managed Care – PPO | Admitting: Physical Therapy

## 2014-02-11 ENCOUNTER — Ambulatory Visit (INDEPENDENT_AMBULATORY_CARE_PROVIDER_SITE_OTHER): Payer: BC Managed Care – PPO | Admitting: General Practice

## 2014-02-11 ENCOUNTER — Encounter: Payer: Self-pay | Admitting: General Practice

## 2014-02-11 ENCOUNTER — Telehealth: Payer: Self-pay | Admitting: Family Medicine

## 2014-02-11 VITALS — BP 168/88 | HR 96 | Temp 99.2°F | Resp 22 | Ht 63.0 in | Wt 208.0 lb

## 2014-02-11 DIAGNOSIS — R059 Cough, unspecified: Secondary | ICD-10-CM

## 2014-02-11 DIAGNOSIS — R509 Fever, unspecified: Secondary | ICD-10-CM

## 2014-02-11 DIAGNOSIS — Z8701 Personal history of pneumonia (recurrent): Secondary | ICD-10-CM

## 2014-02-11 DIAGNOSIS — R062 Wheezing: Secondary | ICD-10-CM

## 2014-02-11 DIAGNOSIS — R05 Cough: Secondary | ICD-10-CM

## 2014-02-11 DIAGNOSIS — J209 Acute bronchitis, unspecified: Secondary | ICD-10-CM

## 2014-02-11 MED ORDER — IPRATROPIUM-ALBUTEROL 0.5-2.5 (3) MG/3ML IN SOLN
3.0000 mL | Freq: Once | RESPIRATORY_TRACT | Status: AC
  Administered 2014-02-11: 3 mL via RESPIRATORY_TRACT

## 2014-02-11 MED ORDER — PREDNISONE (PAK) 10 MG PO TABS: ORAL_TABLET | ORAL | Status: DC

## 2014-02-11 MED ORDER — IPRATROPIUM-ALBUTEROL 0.5-2.5 (3) MG/3ML IN SOLN: 3.0000 mL | RESPIRATORY_TRACT | Status: DC

## 2014-02-11 MED ORDER — METHYLPREDNISOLONE ACETATE 80 MG/ML IJ SUSP
80.0000 mg | Freq: Once | INTRAMUSCULAR | Status: AC
  Administered 2014-02-11: 80 mg via INTRAMUSCULAR

## 2014-02-11 MED ORDER — BENZONATATE 100 MG PO CAPS: 100.0000 mg | ORAL_CAPSULE | Freq: Three times a day (TID) | ORAL | Status: DC | PRN

## 2014-02-11 NOTE — Progress Notes (Signed)
   Subjective:    Patient ID: Caitlin Holmes, female    DOB: 1961-04-29, 53 y.o.   MRN: [id number]  Cough This is a new problem. Episode onset: onset of cough 2 weeks ago. The problem has been gradually worsening. The problem occurs every few minutes. The cough is non-productive. Associated symptoms include a fever and wheezing. Pertinent negatives include no chest pain, chills, headaches, postnasal drip, sore throat or shortness of breath. The symptoms are aggravated by lying down. She has tried ipratropium inhaler and OTC cough suppressant for the symptoms. The treatment provided no relief. Her past medical history is significant for bronchitis and pneumonia. There is no history of COPD.  Fever  This is a new problem. The current episode started in the past 7 days. The problem occurs intermittently. The problem has been unchanged. Her temperature was unmeasured prior to arrival. Associated symptoms include coughing and wheezing. Pertinent negatives include no chest pain, congestion, headaches or sore throat. She has tried nothing for the symptoms.      Review of Systems  Constitutional: Positive for fever. Negative for chills.  HENT: Negative for congestion, postnasal drip, sinus pressure, sneezing and sore throat.   Respiratory: Positive for cough and wheezing. Negative for chest tightness and shortness of breath.   Cardiovascular: Negative for chest pain and palpitations.  Neurological: Negative for dizziness, weakness and headaches.       Objective:   Physical Exam  Constitutional: She appears well-developed and well-nourished.  HENT:  Head: Normocephalic and atraumatic.  Right Ear: External ear normal.  Left Ear: External ear normal.  Eyes: Pupils are equal, round, and reactive to light.  Neck: Normal range of motion. Neck supple.  Cardiovascular: Normal rate, regular rhythm and normal heart sounds.   Pulmonary/Chest: Effort normal. She has wheezes in the right upper field and the  left upper field.  Skin: Skin is warm and dry.  Psychiatric: She has a normal mood and affect.   [facility] reading (PRIMARY) by Coralie Keens, FNP-C, no acute disease.          Assessment & Plan:   1. Cough - DG Chest 2 View; Future - benzonatate (TESSALON) 100 MG capsule; Take 1 capsule (100 mg total) by mouth 3 (three) times daily as needed.  Dispense: 30 capsule; Refill: 0  2. Fever  - DG Chest 2 View; Future  3. History of pneumonia  - DG Chest 2 View; Future  4. Wheezing  - ipratropium-albuterol (DUONEB) 0.5-2.5 (3) MG/3ML nebulizer solution 3 mL; Take 3 mLs by nebulization once.  5. Acute bronchitis -Patient denies any known allergic reaction to depo medrol or prednisone dose pack in the past - methylPREDNISolone acetate (DEPO-MEDROL) injection 80 mg; Inject 1 mL (80 mg total) into the muscle once. - predniSONE (STERAPRED UNI-PAK) 10 MG tablet; Start on 02/12/14  Dispense: 21 tablet; Refill: 0 -discussed adequate hydration -avoid irritants -RTO if symptoms worsen or seek emergency medical treatment -RTO if cough persists greater than 2 weeks -Patient verbalized understanding Coralie Keens, FNP-C

## 2014-02-11 NOTE — Patient Instructions (Signed)

## 2014-02-11 NOTE — Telephone Encounter (Signed)
oxybutin not working wants to try something else?

## 2014-02-16 ENCOUNTER — Encounter: Payer: BC Managed Care – PPO | Admitting: Physical Therapy

## 2014-02-23 ENCOUNTER — Ambulatory Visit: Payer: BC Managed Care – PPO | Admitting: Physical Therapy

## 2014-02-23 DIAGNOSIS — IMO0001 Reserved for inherently not codable concepts without codable children: Secondary | ICD-10-CM | POA: Diagnosis not present

## 2014-02-24 ENCOUNTER — Ambulatory Visit: Payer: BC Managed Care – PPO | Admitting: Physical Therapy

## 2014-02-24 DIAGNOSIS — IMO0001 Reserved for inherently not codable concepts without codable children: Secondary | ICD-10-CM | POA: Diagnosis not present

## 2014-03-02 ENCOUNTER — Encounter: Payer: Self-pay | Admitting: Family

## 2014-03-02 ENCOUNTER — Ambulatory Visit: Payer: BC Managed Care – PPO | Admitting: Physical Therapy

## 2014-03-02 ENCOUNTER — Ambulatory Visit (INDEPENDENT_AMBULATORY_CARE_PROVIDER_SITE_OTHER): Payer: BC Managed Care – PPO | Admitting: Family

## 2014-03-02 VITALS — BP 139/77 | HR 109 | Temp 98.7°F | Ht 63.0 in

## 2014-03-02 DIAGNOSIS — IMO0001 Reserved for inherently not codable concepts without codable children: Secondary | ICD-10-CM | POA: Diagnosis not present

## 2014-03-02 DIAGNOSIS — R0602 Shortness of breath: Secondary | ICD-10-CM

## 2014-03-02 DIAGNOSIS — R05 Cough: Secondary | ICD-10-CM

## 2014-03-02 DIAGNOSIS — R059 Cough, unspecified: Secondary | ICD-10-CM

## 2014-03-02 MED ORDER — ALBUTEROL SULFATE (2.5 MG/3ML) 0.083% IN NEBU: 2.5000 mg | INHALATION_SOLUTION | Freq: Four times a day (QID) | RESPIRATORY_TRACT | Status: AC | PRN

## 2014-03-02 NOTE — Patient Instructions (Signed)

## 2014-03-02 NOTE — Progress Notes (Signed)
   Subjective:    Patient ID: [place] Caitlin Holmes A Caitlin Holmes, female    DOB: 1961/01/30, 53 y.o.   MRN: [id number]  Shortness of Breath This is a recurrent problem. The current episode started 1 to 4 weeks ago (two weeks ago). The problem occurs every few minutes. The problem has been gradually worsening. Associated symptoms include a fever and wheezing. Pertinent negatives include no ear pain, headaches, rhinorrhea or sore throat. The symptoms are aggravated by weather changes and any activity. She has tried rest (Mucinex) for the symptoms. The treatment provided mild relief. There is no history of allergies, asthma or pneumonia.      Review of Systems  Constitutional: Positive for fever.  HENT: Negative for ear pain, rhinorrhea and sore throat.   Respiratory: Positive for cough, shortness of breath and wheezing.   Gastrointestinal: Negative.   Genitourinary: Negative.   Musculoskeletal: Negative.   Neurological: Negative for headaches.  Hematological: Negative.   All other systems reviewed and are negative.      Objective:   Physical Exam  Vitals reviewed. Constitutional: She is oriented to person, place, and time. She appears well-developed and well-nourished. No distress.  HENT:  Head: Normocephalic and atraumatic.  Right Ear: External ear normal.  Mouth/Throat: Oropharynx is clear and moist.  Eyes: Pupils are equal, round, and reactive to light.  Neck: Normal range of motion. Neck supple. No thyromegaly present.  Cardiovascular: Normal rate, regular rhythm, normal heart sounds and intact distal pulses.   No murmur heard. Pulmonary/Chest: Effort normal and breath sounds normal. No respiratory distress. She has no wheezes.  Lungs clear bilaterally- Dry constant cough  Abdominal: Soft. Bowel sounds are normal. She exhibits no distension. There is no tenderness.  Musculoskeletal: Normal range of motion. She exhibits no edema and no tenderness.  Neurological: She is alert and oriented to  person, place, and time. She has normal reflexes. No cranial nerve deficit.  Skin: Skin is warm and dry.  Psychiatric: She has a normal mood and affect. Her behavior is normal. Judgment and thought content normal.    BP 139/77  Pulse 109  Temp(Src) 98.7 F (37.1 C) (Oral)  Ht 5\' 3"  (1.6 m)  SpO2 99%       Assessment & Plan:  1. SOB (shortness of breath) -Testing for Alpha 1 antitrypsin deficiency - Ambulatory referral to Pulmonology - DME Nebulizer machine - albuterol (PROVENTIL) (2.5 MG/3ML) 0.083% nebulizer solution; Take 3 mLs (2.5 mg total) by nebulization every 6 (six) hours as needed for wheezing or shortness of breath.  Dispense: 150 mL; Refill: 1  2. Cough - Ambulatory referral to Pulmonology  Jannifer Rodneyhristy Hawks, FNP

## 2014-03-03 ENCOUNTER — Telehealth: Payer: Self-pay | Admitting: *Deleted

## 2014-03-03 NOTE — Telephone Encounter (Signed)
Nebulizer machine wasn't covered under specified diagnosis. We are unable to provide a covered diagnosis at this time. Faxed this back to [facility]. She is being referred to a pulmonologist and they may be able to provide a covered diagnosis.

## 2014-03-04 ENCOUNTER — Encounter: Payer: BC Managed Care – PPO | Admitting: Physical Therapy

## 2014-03-09 ENCOUNTER — Telehealth: Payer: Self-pay | Admitting: Family

## 2014-03-09 ENCOUNTER — Ambulatory Visit: Payer: BC Managed Care – PPO | Admitting: Physical Therapy

## 2014-03-09 DIAGNOSIS — IMO0001 Reserved for inherently not codable concepts without codable children: Secondary | ICD-10-CM | POA: Diagnosis not present

## 2014-03-09 NOTE — Telephone Encounter (Signed)
Patient aware we have no results back.

## 2014-03-11 ENCOUNTER — Ambulatory Visit: Payer: BC Managed Care – PPO | Attending: Family Medicine | Admitting: Physical Therapy

## 2014-03-11 DIAGNOSIS — M545 Low back pain, unspecified: Secondary | ICD-10-CM | POA: Diagnosis not present

## 2014-03-11 DIAGNOSIS — IMO0001 Reserved for inherently not codable concepts without codable children: Secondary | ICD-10-CM | POA: Insufficient documentation

## 2014-03-11 DIAGNOSIS — Z9181 History of falling: Secondary | ICD-10-CM | POA: Diagnosis not present

## 2014-03-11 DIAGNOSIS — R293 Abnormal posture: Secondary | ICD-10-CM | POA: Insufficient documentation

## 2014-03-11 DIAGNOSIS — I10 Essential (primary) hypertension: Secondary | ICD-10-CM | POA: Insufficient documentation

## 2014-03-11 DIAGNOSIS — M6281 Muscle weakness (generalized): Secondary | ICD-10-CM | POA: Insufficient documentation

## 2014-03-16 ENCOUNTER — Encounter: Payer: BC Managed Care – PPO | Admitting: Physical Therapy

## 2014-03-16 ENCOUNTER — Institutional Professional Consult (permissible substitution): Payer: BC Managed Care – PPO | Admitting: Internal Medicine

## 2014-03-17 ENCOUNTER — Institutional Professional Consult (permissible substitution): Payer: BC Managed Care – PPO | Admitting: Internal Medicine

## 2014-03-18 ENCOUNTER — Telehealth: Payer: Self-pay | Admitting: Family

## 2014-03-18 ENCOUNTER — Other Ambulatory Visit: Payer: Self-pay | Admitting: Family Medicine

## 2014-03-18 ENCOUNTER — Encounter: Payer: BC Managed Care – PPO | Admitting: Physical Therapy

## 2014-03-18 NOTE — Telephone Encounter (Signed)
Test not back still coughing and sob

## 2014-03-22 ENCOUNTER — Other Ambulatory Visit: Payer: Self-pay | Admitting: Nurse Practitioner

## 2014-03-22 ENCOUNTER — Telehealth: Payer: Self-pay | Admitting: Internal Medicine

## 2014-03-22 NOTE — Telephone Encounter (Signed)
Alpha 1 genetic testing was normal. Patient made aware. Copy to be scanned into EMR.  Patient will be seeing pulmonary tomorrow.

## 2014-03-22 NOTE — Telephone Encounter (Signed)
I called pt made her aware of MW recs. She will bring ALL her medications with her. Nothing further needed

## 2014-03-22 NOTE — Telephone Encounter (Signed)
Please advise MW thanks 

## 2014-03-22 NOTE — Telephone Encounter (Signed)
Ok with me 

## 2014-03-22 NOTE — Telephone Encounter (Signed)
Ok but needs to bring all meds from all docs and otc's with her

## 2014-03-22 NOTE — Telephone Encounter (Signed)
Pt needs to keep appointment with Pulmonologist.

## 2014-03-22 NOTE — Telephone Encounter (Signed)
Please advise KC if okay? thanks 

## 2014-03-23 ENCOUNTER — Ambulatory Visit (INDEPENDENT_AMBULATORY_CARE_PROVIDER_SITE_OTHER): Payer: BC Managed Care – PPO | Admitting: Internal Medicine

## 2014-03-23 ENCOUNTER — Encounter (INDEPENDENT_AMBULATORY_CARE_PROVIDER_SITE_OTHER): Payer: Self-pay

## 2014-03-23 ENCOUNTER — Encounter: Payer: Self-pay | Admitting: Internal Medicine

## 2014-03-23 VITALS — BP 122/84 | HR 78 | Temp 97.9°F | Ht 63.0 in | Wt 211.0 lb

## 2014-03-23 DIAGNOSIS — R059 Cough, unspecified: Secondary | ICD-10-CM

## 2014-03-23 DIAGNOSIS — R0989 Other specified symptoms and signs involving the circulatory and respiratory systems: Secondary | ICD-10-CM

## 2014-03-23 DIAGNOSIS — R053 Chronic cough: Secondary | ICD-10-CM

## 2014-03-23 DIAGNOSIS — R0609 Other forms of dyspnea: Secondary | ICD-10-CM

## 2014-03-23 DIAGNOSIS — R0600 Dyspnea, unspecified: Secondary | ICD-10-CM

## 2014-03-23 DIAGNOSIS — R05 Cough: Secondary | ICD-10-CM

## 2014-03-23 MED ORDER — FAMOTIDINE 20 MG PO TABS: ORAL_TABLET | ORAL | Status: AC

## 2014-03-23 MED ORDER — GABAPENTIN 300 MG PO CAPS: 300.0000 mg | ORAL_CAPSULE | Freq: Three times a day (TID) | ORAL | Status: AC

## 2014-03-23 MED ORDER — PANTOPRAZOLE SODIUM 40 MG PO TBEC: 40.0000 mg | DELAYED_RELEASE_TABLET | Freq: Every day | ORAL | Status: AC

## 2014-03-23 NOTE — Patient Instructions (Addendum)
Stop symbicort   Pantoprazole (protonix) 40 mg   Take 30-60 min before first meal of the day and Pepcid 20 mg one bedtime until return to office - this is the best way to tell whether stomach acid is contributing to your problem.    For drainage/ tickle  take chlortrimeton (chlorpheniramine) 4 mg every 4 hours available over the counter (may cause drowsiness)   Increase the neurontin to 300 three times daily   If you start wheezing try purse lip breathing   Please see patient coordinator before you leave today  to schedule sinus ct   GERD (REFLUX)  is an extremely common cause of respiratory symptoms, many times with no significant heartburn at all.    It can be treated with medication, but also with lifestyle changes including avoidance of late meals, excessive alcohol, smoking cessation, and avoid fatty foods, chocolate, peppermint, colas, red wine, and acidic juices such as orange juice.  NO MINT OR MENTHOL PRODUCTS SO NO COUGH DROPS  USE SUGARLESS CANDY INSTEAD (jolley ranchers or Stover's and life savers)  NO OIL BASED VITAMINS - use powdered substitutes.  Please schedule a follow up office visit in 4 weeks, sooner if needed

## 2014-03-23 NOTE — Progress Notes (Signed)
Subjective:    Patient ID: Caitlin Holmes, female    DOB: 02/12/1961  MRN: [id number]  HPI  53 yowf quit smoking completely age 53 but played sports in school and did fine until csf leak started have sinus problems early 30s then around 2010 noted cough on and off with only about 3 weeks periods where she felt some better  between and severe enough to vomit worse with inhalers and prev eval by Dr Shelle Iron but self referred to [facility] 03/23/2014 for second opinion p his rec Take chlorpheniramine only as needed rather than everyday.  Cut back to just one 4mg  tab when you do take it. Decrease omeprazole to once a day everyday. Try and stop using tramadol for cough as much as you can, so we can see how things are going.     03/23/2014 1st [facility] office visit/ Wert  Chief Complaint  Patient presents with  . Pulmonary Consult    Referred by Dr. Lendon Colonel for SOB with exertion   3 week reprieve = still coughs some min production scamt ,mucoid more day than during sleeping hours but really coughing x 5 year total No Sinus ct since onset  SOB x 30 ft even if not coughing No better on symbicort       Kouffman Reflux v Neurogenic Cough Differentiator Reflux Comments  Do you awaken from a sound sleep coughing violently?                            With trouble breathing? Yes   Do you have choking episodes when you cannot  Get enough air, gasping for air ?              Yes   Do you usually cough when you lie down into  The bed, or when you just lie down to rest ?                          no   Do you usually cough after meals or eating?         Drinking cold   Do you cough when (or after) you bend over?    no   GERD SCORE     Kouffman Reflux v Neurogenic Cough Differentiator Neurogenic   Do you more-or-less cough all day long? no   Does change of temperature make you cough? yes   Does laughing or chuckling cause you to cough? yes   Do fumes (perfume, automobile fumes, burned  Toast,  etc.,) cause you to cough ?      No    Does speaking, singing, or talking on the phone cause you to cough   ?               yes   Neurogenic/Airway score     No obvious other patterns in day to day or daytime variabilty or assoc  cp or chest tightness, subjective wheeze overt sinus or hb symptoms. No unusual exp hx or h/o childhood pna/ asthma or knowledge of premature birth.  Sleeping ok without nocturnal  or early am exacerbation  of respiratory  c/o's or need for noct saba. Also denies any obvious fluctuation of symptoms with weather or environmental changes or other aggravating or alleviating factors except as outlined above   Current Medications, Allergies, Complete Past Medical History, Past Surgical History, Family History, and Social History were reviewed in  [facility] electronic medical record.           Review of Systems  Constitutional: Positive for unexpected weight change. Negative for fever.  HENT: Positive for congestion and sinus pressure. Negative for dental problem, ear pain, nosebleeds, postnasal drip, rhinorrhea, sneezing, sore throat and trouble swallowing.   Eyes: Negative for redness and itching.  Respiratory: Positive for cough and shortness of breath. Negative for chest tightness and wheezing.   Cardiovascular: Negative for palpitations and leg swelling.  Gastrointestinal: Negative for nausea and vomiting.  Genitourinary: Negative for dysuria.  Skin: Negative for rash.  Neurological: Positive for headaches.  Hematological: Does not bruise/bleed easily.  Psychiatric/Behavioral: Positive for dysphoric mood. The patient is nervous/anxious.        Objective:   Physical Exam  amb pleasant wf nad  Wt Readings from Last 3 Encounters:  03/23/14 211 lb (95.709 kg)  02/11/14 208 lb (94.348 kg)  01/25/14 203 lb 12.8 oz (92.443 kg)     HEENT: nl dentition, turbinates, and orophanx. Nl external ear canals without cough reflex   NECK :  without JVD/Nodes/TM/  nl carotid upstrokes bilaterally   LUNGS: no acc muscle use, clear to A and P bilaterally without cough on insp or exp maneuvers   CV:  RRR  no s3 or murmur or increase in P2, no edema   ABD:  soft and nontender with nl excursion in the supine position. No bruits or organomegaly, bowel sounds nl  MS:  warm without deformities, calf tenderness, cyanosis or clubbing  SKIN: warm and dry without lesions    NEURO:  alert, approp, no deficits    cxr 02/11/14  No active cardiopulmonary disease.         Assessment & Plan:

## 2014-03-24 NOTE — Assessment & Plan Note (Signed)
03/23/2014   Walked RA x one lap @ 185 stopped due to  Sob no desats  Unable to reproduce this chronic complaint today, will f/u

## 2014-03-24 NOTE — Assessment & Plan Note (Addendum)
Normal cxr per pt 03/2011 Cleda DaubSpiro 01/2013:  Normal  CXR 01/2013: no acute process  The most common causes of chronic cough in immunocompetent adults include the following: upper airway cough syndrome (UACS), previously referred to as postnasal drip syndrome (PNDS), which is caused by variety of rhinosinus conditions; (2) asthma; (3) GERD; (4) chronic bronchitis from cigarette smoking or other inhaled environmental irritants; (5) nonasthmatic eosinophilic bronchitis; and (6) bronchiectasis.   These conditions, singly or in combination, have accounted for up to 94% of the causes of chronic cough in prospective studies.   Other conditions have constituted no >6% of the causes in prospective studies These have included bronchogenic carcinoma, chronic interstitial pneumonia, sarcoidosis, left ventricular failure, ACEI-induced cough, and aspiration from a condition associated with pharyngeal dysfunction.    Chronic cough is often simultaneously caused by more than one condition. A single cause has been found from 38 to 82% of the time, multiple causes from 18 to 62%. Multiply caused cough has been the result of three diseases up to 42% of the time.       Based on hx and exam, this is most likely:  Classic Upper airway cough syndrome, so named because it's frequently impossible to sort out how much is  CR/sinusitis with freq throat clearing (which can be related to primary GERD)   vs  causing  secondary (" extra esophageal")  GERD from wide swings in gastric pressure that occur with throat clearing, often  promoting self use of mint and menthol lozenges that reduce the lower esophageal sphincter tone and exacerbate the problem further in a cyclical fashion.   These are the same pts (now being labeled as having "irritable larynx syndrome" by some cough centers) who not infrequently have a history of having failed to tolerate ace inhibitors,  dry powder inhalers (or even hfa ics as may be the case here)or  biphosphonates or report having atypical reflux symptoms that don't respond to standard doses of PPI , and are easily confused as having aecopd or asthma flares by even experienced allergists/ pulmonologists.   The first step is to maximize acid suppression and eliminate cyclical coughing then regroup if the cough persists.  See instructions for specific recommendations which were reviewed directly with the patient who was given a copy with highlighter outlining the key components.

## 2014-03-30 ENCOUNTER — Other Ambulatory Visit: Payer: BC Managed Care – PPO

## 2014-04-02 ENCOUNTER — Other Ambulatory Visit: Payer: BC Managed Care – PPO

## 2014-04-06 ENCOUNTER — Telehealth: Payer: Self-pay | Admitting: Internal Medicine

## 2014-04-06 ENCOUNTER — Ambulatory Visit: Payer: BC Managed Care – PPO | Admitting: Physical Therapy

## 2014-04-06 NOTE — Telephone Encounter (Signed)
This is a very longterm issue and in short run no problem at all but needs to be addressed in detail next ov as a risk / benefit concern as there are very limited options/ alternatives (they all have the same label warning)

## 2014-04-06 NOTE — Telephone Encounter (Signed)
Per OV 03/23/14; Pantoprazole (protonix) 40 mg   Take 30-60 min before first meal of the day and Pepcid 20 mg one bedtime until return to office - this is the best way to tell whether stomach acid is contributing to your problem.   --  Called spoke with pt. She reports on the warning label of the protonix it states not to start the medication if "you have weak bones or osteoporosis". Pt reports she has osteoporsis. Wants to know if Dr. Sherene SiresWert wants to RX something different. Please advise thanks  . Allergies  Allergen Reactions  . Advair Diskus [Fluticasone-Salmeterol] Cough  . Amoxicillin Itching  . Naproxen Hives  . Penicillins Itching  . Sertraline Hcl Hives  . Zoloft [Sertraline Hcl] Hives

## 2014-04-06 NOTE — Telephone Encounter (Signed)
Called made pt aware. Nothing further needed 

## 2014-04-09 ENCOUNTER — Inpatient Hospital Stay: Admission: RE | Admit: 2014-04-09 | Payer: BC Managed Care – PPO | Source: Ambulatory Visit

## 2014-04-10 DEATH — deceased

## 2014-04-20 ENCOUNTER — Ambulatory Visit: Payer: BC Managed Care – PPO | Admitting: Internal Medicine

## 2014-05-14 IMAGING — CR DG SACRUM/COCCYX 2+V
3 series · 3 of 3 positions shown · non-contrast
Comparison: 09/09/2009

CLINICAL DATA: Pain post fall.

EXAM:
SACRUM AND COCCYX - 2+ VIEW

[view not recorded (1 of 3)]
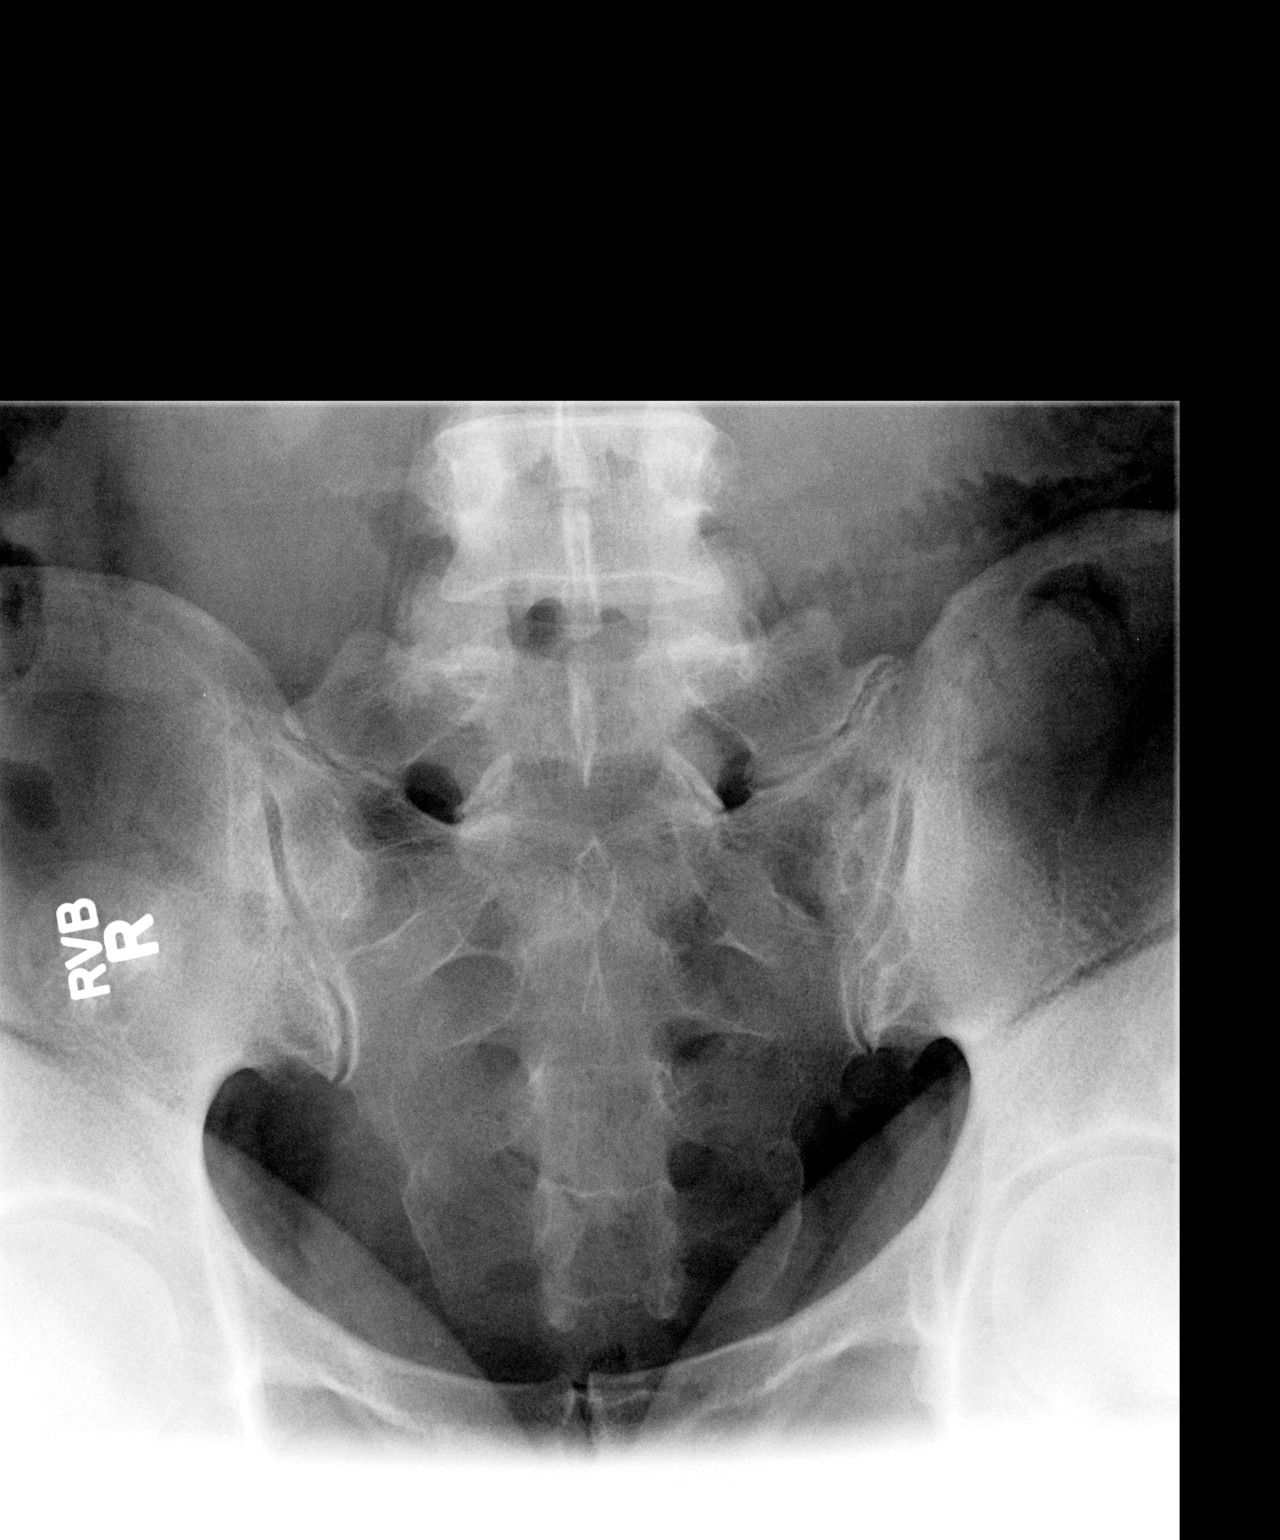

[view not recorded (2 of 3)]
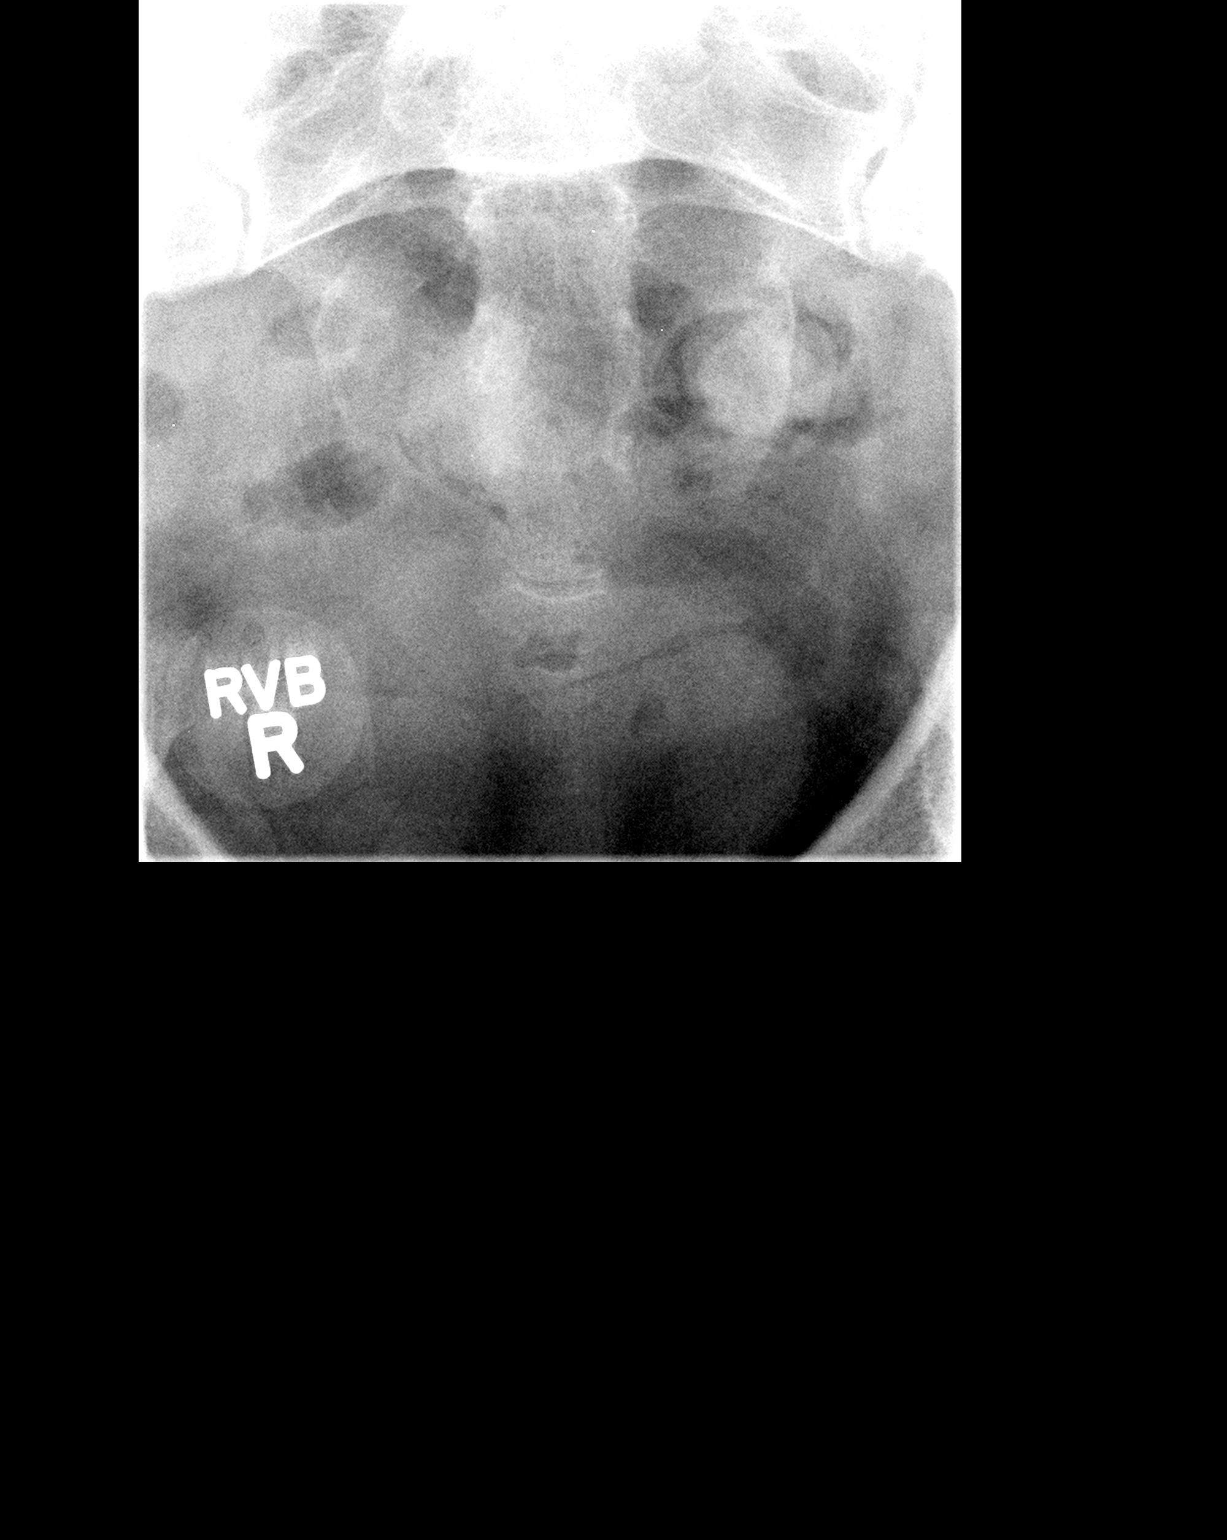

[view not recorded (3 of 3)]
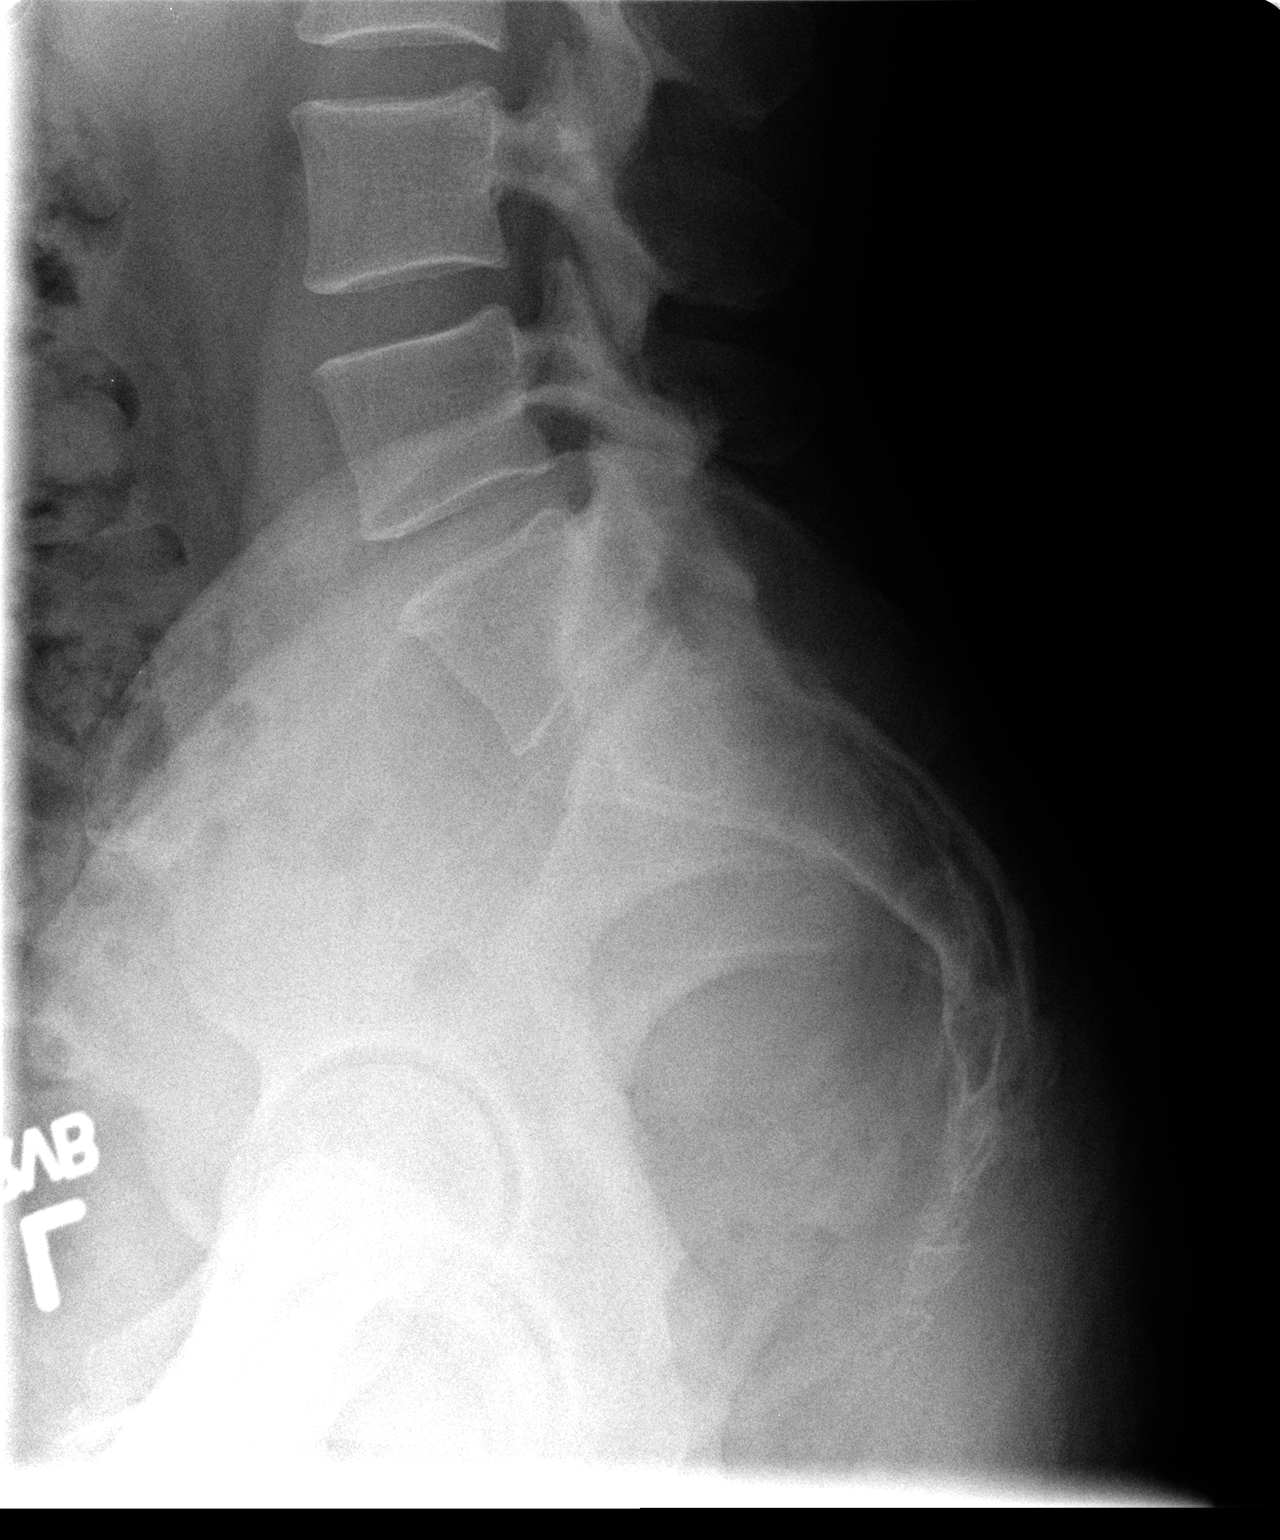

[3 of 3 positions shown; findings below may reference images not displayed]

FINDINGS: Transitional lumbosacral segment. Negative for fracture,
dislocation, or other acute bone abnormality. Arcuate lines intact.
Normal mineralization and alignment.
IMPRESSION: Negative.

## 2014-05-14 IMAGING — CR DG LUMBAR SPINE COMPLETE 4+V
5 series · 5 of 5 positions shown · non-contrast
Comparison: 09/09/2009

CLINICAL DATA: Pain post fall.

EXAM:
LUMBAR SPINE - COMPLETE 4+ VIEW

[view not recorded (1 of 5)]
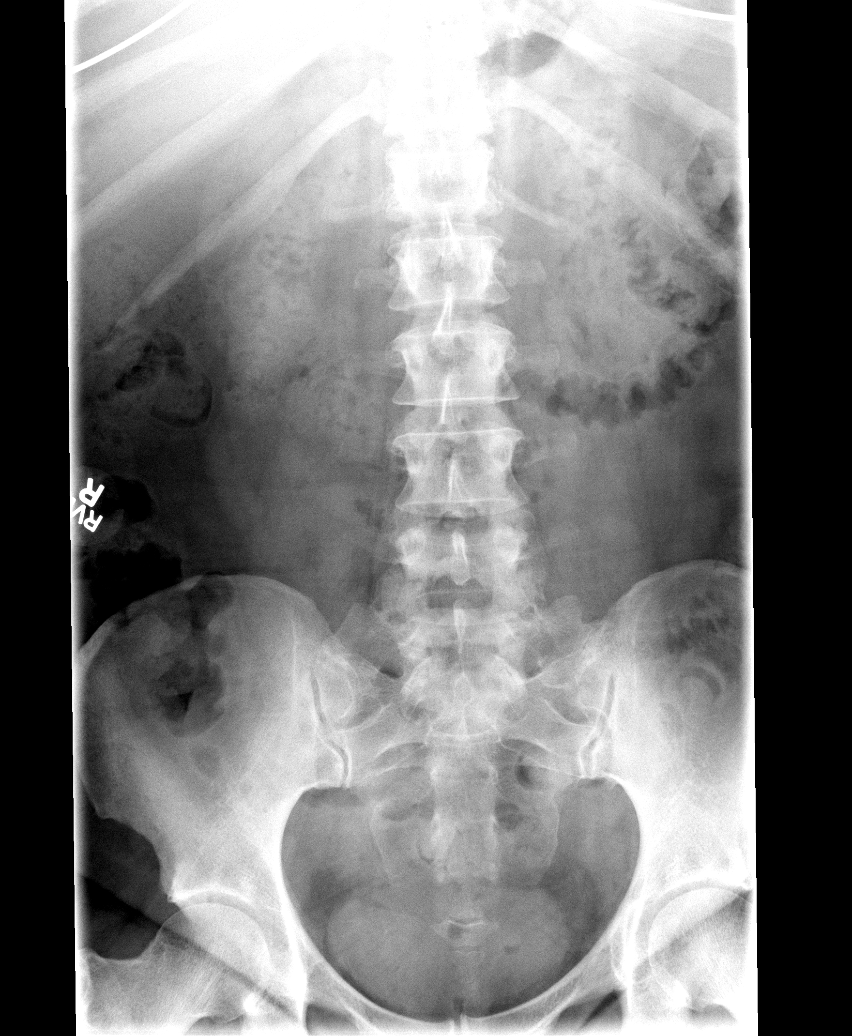

[view not recorded (2 of 5)]
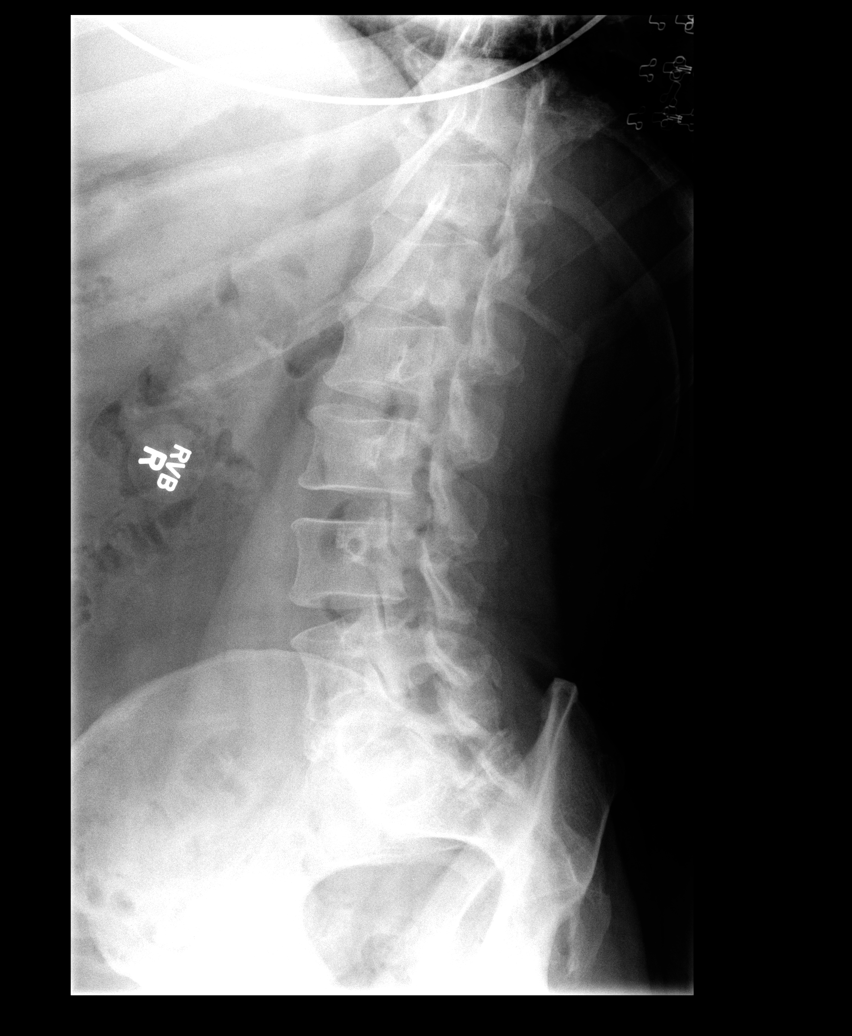

[view not recorded (3 of 5)]
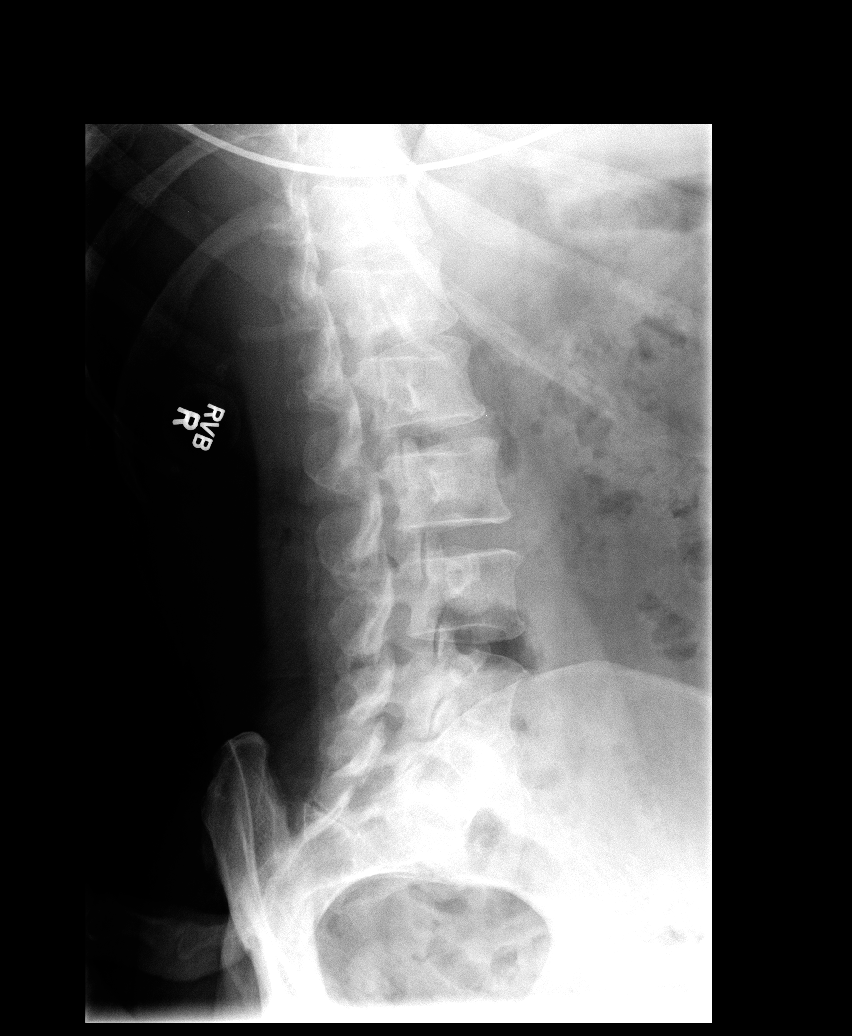

[view not recorded (4 of 5)]
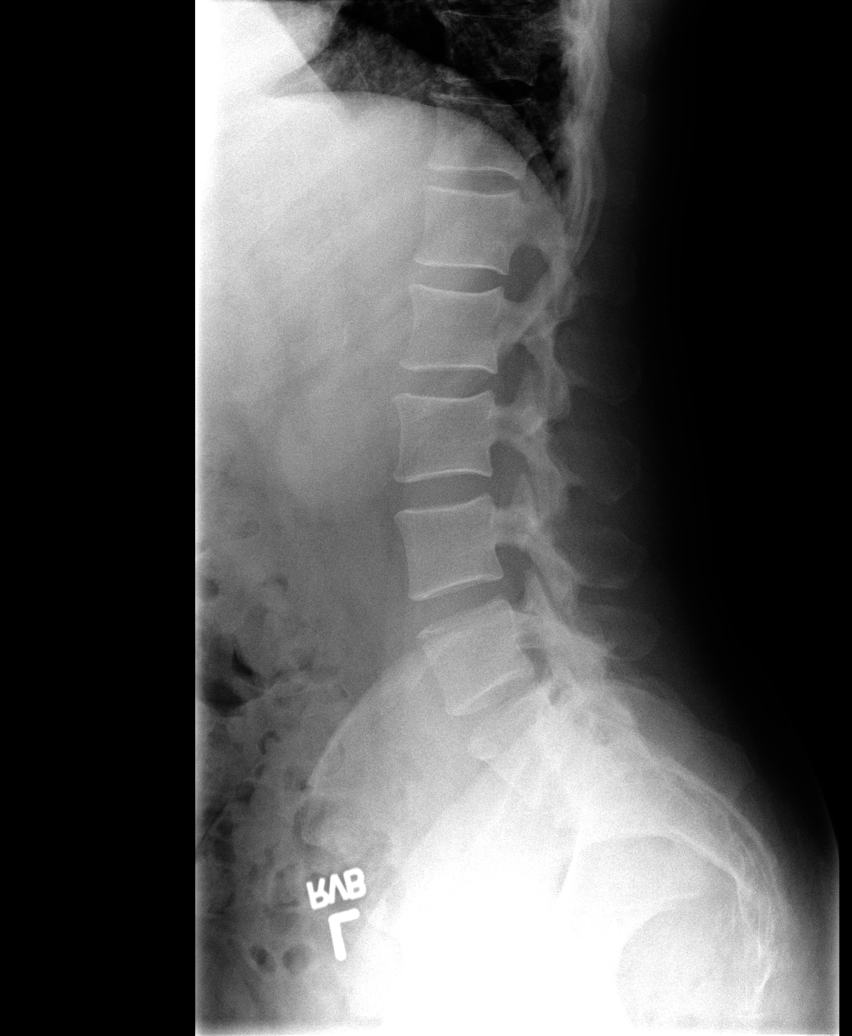

[view not recorded (5 of 5)]
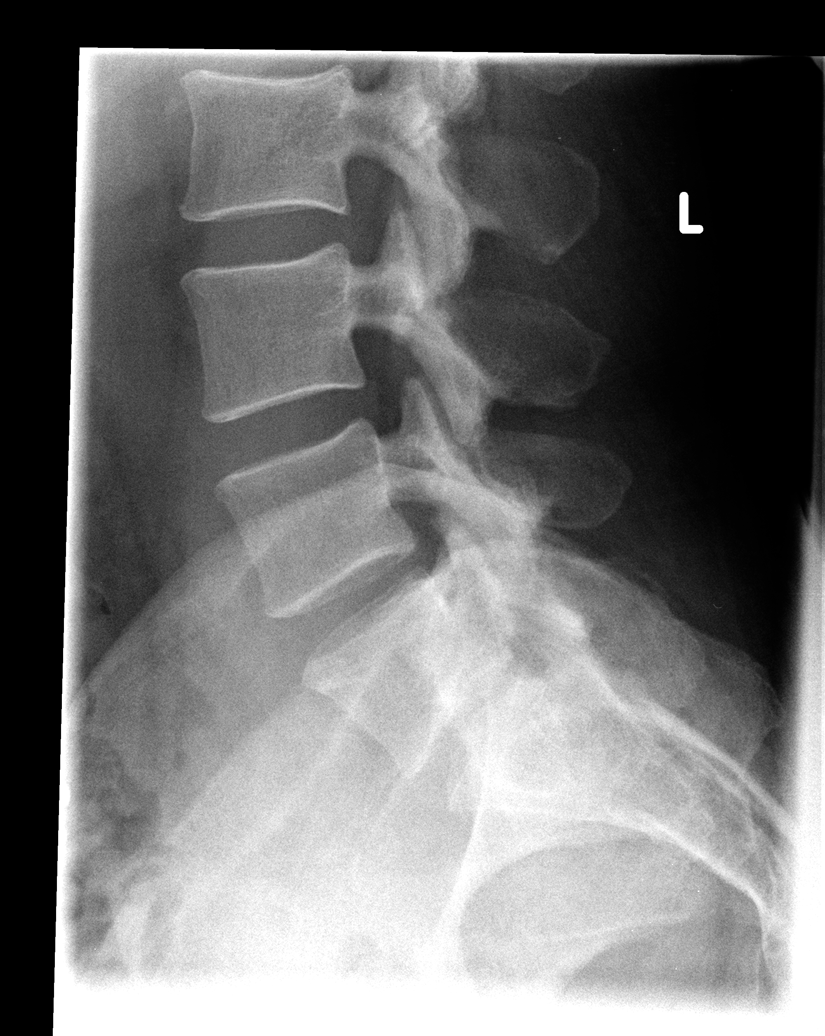

[5 of 5 positions shown; findings below may reference images not displayed]

FINDINGS: Transitional lumbosacral segment . There is no evidence of lumbar
spine fracture. Alignment is normal. Intervertebral disc spaces are
maintained.
IMPRESSION: Negative.

## 2014-09-27 IMAGING — CR DG CHEST 2V
2 series · 2 of 2 positions shown · non-contrast
Comparison: DG CHEST 2V dated 09/15/2013;

CLINICAL DATA: Cough.  Follow-up pneumonia.

EXAM:
CHEST  2 VIEW

[view not recorded (1 of 2)]
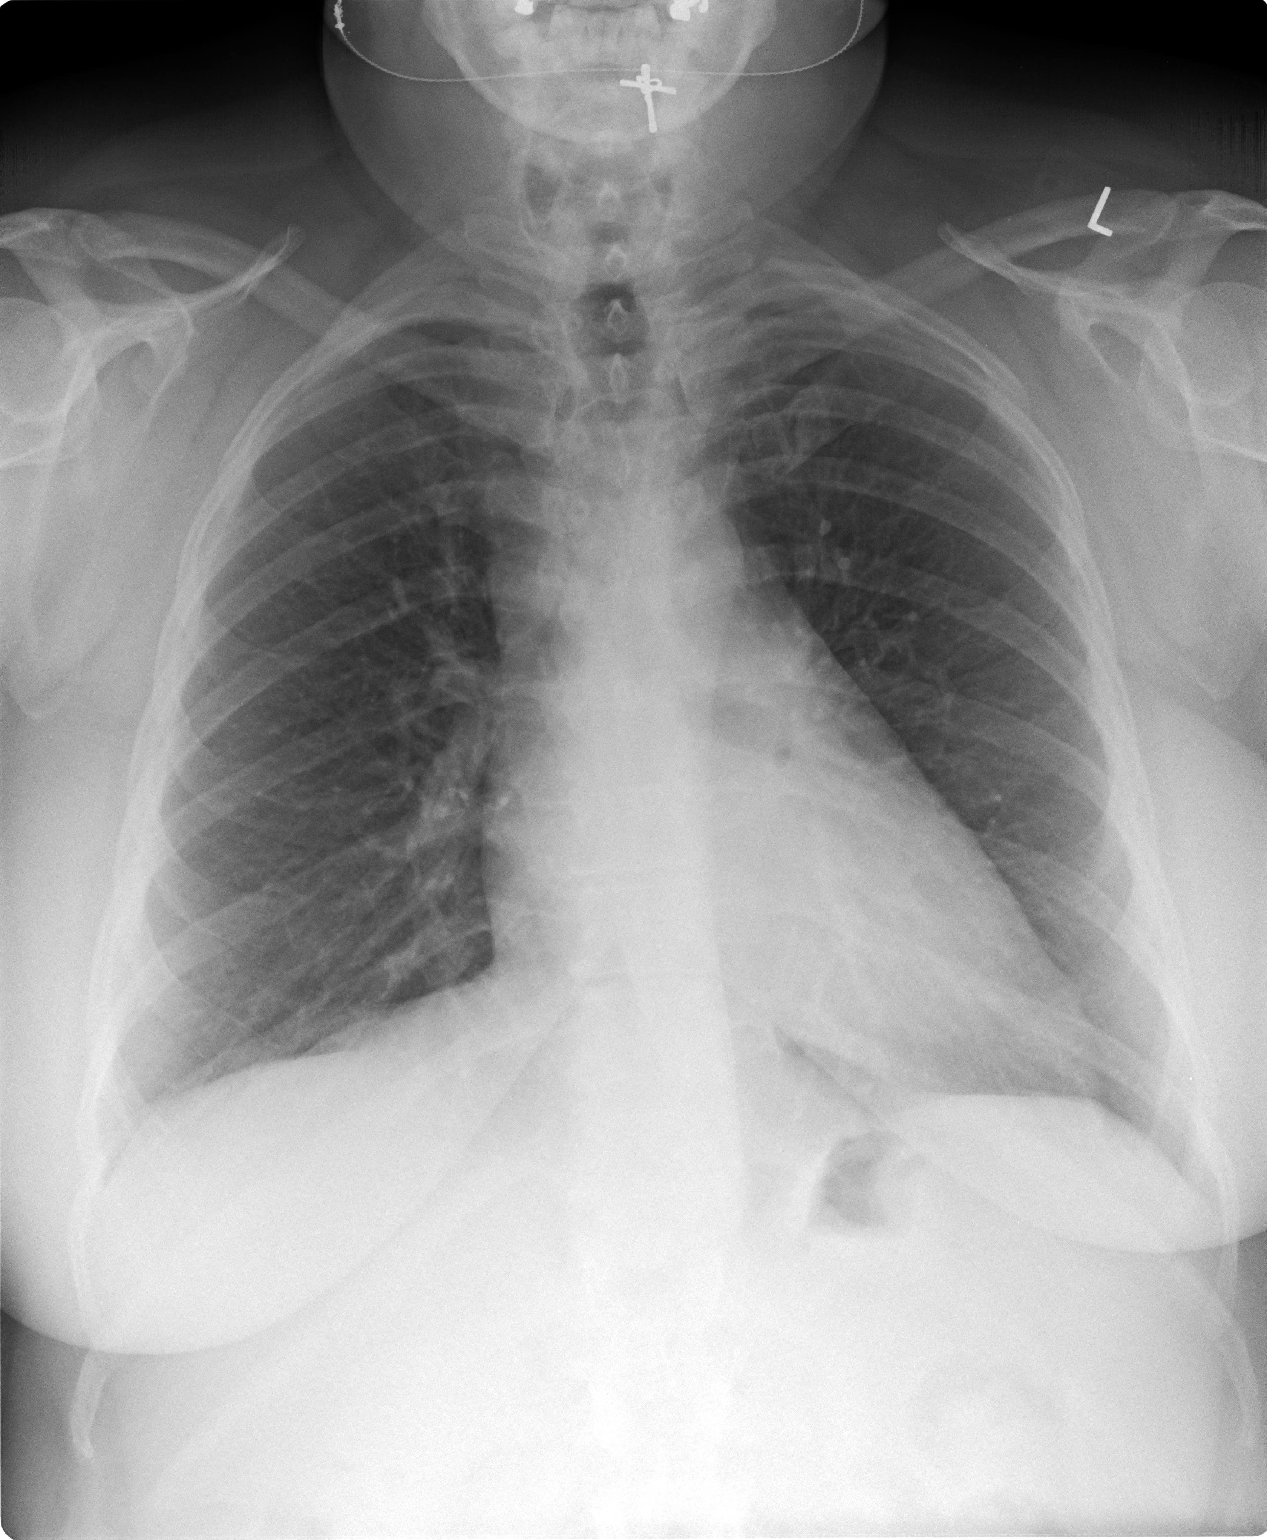

[view not recorded (2 of 2)]
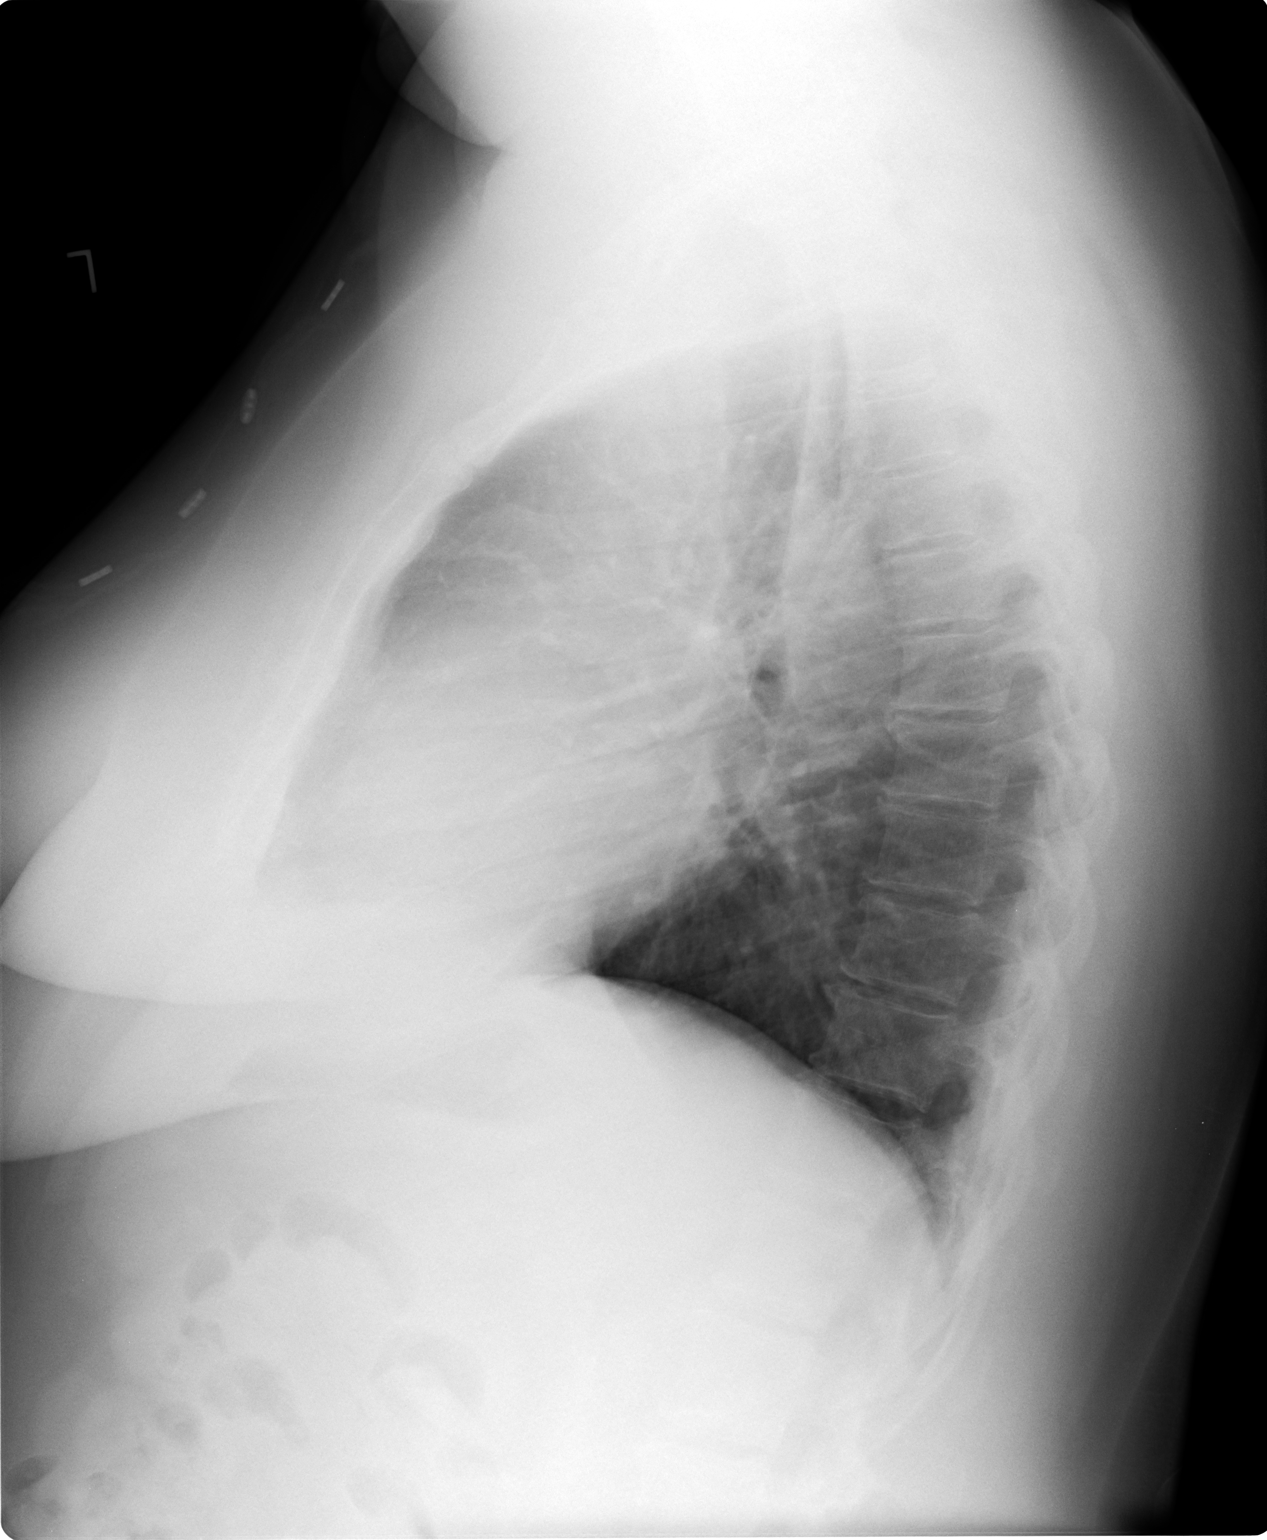

[2 of 2 positions shown; findings below may reference images not displayed]

DG CHEST 2 VIEW dated
01/26/2013; DG CHEST 1V PORT dated 09/09/2009; DG CHEST 2 VIEW dated
10/26/2003
FINDINGS: Interval resolution of the patchy pneumonia in the right lower lobe.
Lungs now all clear. Bronchovascular markings normal. Pulmonary
vascularity normal. No visible pleural effusions. No pneumothorax.
Cardiomediastinal silhouette unremarkable and unchanged. Mild
degenerative changes involving the thoracic spine.
IMPRESSION: No acute cardiopulmonary disease. Resolution of right lower lobe
pneumonia since the examination 1 month ago.
